# Patient Record
Sex: Female | Born: 2000 | Race: White | Hispanic: No | Marital: Single | Smoking: Former smoker
Health system: Southern US, Community
[De-identification: ages and names within clinical notes are randomized; demographics above are authoritative.]

## PROBLEM LIST (undated history)

## (undated) DIAGNOSIS — F419 Anxiety disorder, unspecified: Secondary | ICD-10-CM

## (undated) DIAGNOSIS — F32A Depression, unspecified: Secondary | ICD-10-CM

## (undated) DIAGNOSIS — F329 Major depressive disorder, single episode, unspecified: Secondary | ICD-10-CM

## (undated) HISTORY — PX: NO PAST SURGERIES: SHX2092

---
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
## 2018-03-19 ENCOUNTER — Ambulatory Visit (INDEPENDENT_AMBULATORY_CARE_PROVIDER_SITE_OTHER): Payer: BC Managed Care – PPO | Admitting: Nurse Practitioner

## 2018-03-19 ENCOUNTER — Other Ambulatory Visit
Admission: RE | Admit: 2018-03-19 | Discharge: 2018-03-19 | Disposition: A | Discharge status: Home / Self Care | Payer: BC Managed Care – PPO | Source: Ambulatory Visit | Attending: Nurse Practitioner | Admitting: Nurse Practitioner

## 2018-03-19 ENCOUNTER — Other Ambulatory Visit: Payer: Self-pay | Admitting: Nurse Practitioner

## 2018-03-19 ENCOUNTER — Encounter: Payer: Self-pay | Admitting: Nurse Practitioner

## 2018-03-19 ENCOUNTER — Other Ambulatory Visit (HOSPITAL_COMMUNITY)
Admission: RE | Admit: 2018-03-19 | Discharge: 2018-03-19 | Disposition: A | Discharge status: Home / Self Care | Payer: BC Managed Care – PPO | Source: Ambulatory Visit | Attending: Nurse Practitioner | Admitting: Nurse Practitioner

## 2018-03-19 VITALS — BP 100/66 | HR 93 | Temp 98.4°F | Resp 12 | Ht 63.0 in | Wt 122.4 lb

## 2018-03-19 DIAGNOSIS — B9689 Other specified bacterial agents as the cause of diseases classified elsewhere: Secondary | ICD-10-CM | POA: Insufficient documentation

## 2018-03-19 DIAGNOSIS — R51 Headache: Secondary | ICD-10-CM

## 2018-03-19 DIAGNOSIS — R52 Pain, unspecified: Secondary | ICD-10-CM

## 2018-03-19 DIAGNOSIS — R6883 Chills (without fever): Secondary | ICD-10-CM

## 2018-03-19 DIAGNOSIS — R519 Headache, unspecified: Secondary | ICD-10-CM

## 2018-03-19 DIAGNOSIS — R5383 Other fatigue: Secondary | ICD-10-CM

## 2018-03-19 DIAGNOSIS — R1030 Lower abdominal pain, unspecified: Secondary | ICD-10-CM | POA: Diagnosis present

## 2018-03-19 DIAGNOSIS — A749 Chlamydial infection, unspecified: Secondary | ICD-10-CM | POA: Insufficient documentation

## 2018-03-19 DIAGNOSIS — N760 Acute vaginitis: Secondary | ICD-10-CM | POA: Insufficient documentation

## 2018-03-19 DIAGNOSIS — H6983 Other specified disorders of Eustachian tube, bilateral: Secondary | ICD-10-CM

## 2018-03-19 DIAGNOSIS — F32A Depression, unspecified: Secondary | ICD-10-CM

## 2018-03-19 DIAGNOSIS — F419 Anxiety disorder, unspecified: Secondary | ICD-10-CM

## 2018-03-19 DIAGNOSIS — H6993 Unspecified Eustachian tube disorder, bilateral: Secondary | ICD-10-CM

## 2018-03-19 DIAGNOSIS — N3090 Cystitis, unspecified without hematuria: Secondary | ICD-10-CM

## 2018-03-19 DIAGNOSIS — F329 Major depressive disorder, single episode, unspecified: Secondary | ICD-10-CM

## 2018-03-19 LAB — POCT URINALYSIS DIPSTICK
Bilirubin, UA: NEGATIVE
Glucose, UA: NEGATIVE
Ketones, UA: NEGATIVE
NITRITE UA: NEGATIVE
PH UA: 8 (ref 5.0–8.0)
PROTEIN UA: POSITIVE — AB
RBC UA: NEGATIVE
Spec Grav, UA: <=1.005 — AB (ref 1.010–1.025)
UROBILINOGEN UA: 0.2 U/dL

## 2018-03-19 LAB — CBC WITH DIFFERENTIAL/PLATELET
ABS IMMATURE GRANULOCYTES: 0.01 10*3/uL (ref 0.00–0.07)
Basophils Absolute: 0 10*3/uL (ref 0.0–0.1)
Basophils Relative: 0 %
EOS PCT: 2 %
Eosinophils Absolute: 0.1 10*3/uL (ref 0.0–0.5)
HEMATOCRIT: 40.6 % (ref 36.0–46.0)
Hemoglobin: 13.3 g/dL (ref 12.0–15.0)
Immature Granulocytes: 0 %
LYMPHS ABS: 2.2 10*3/uL (ref 0.7–4.0)
LYMPHS PCT: 30 %
MCH: 29.2 pg (ref 26.0–34.0)
MCHC: 32.8 g/dL (ref 30.0–36.0)
MCV: 89.2 fL (ref 80.0–100.0)
MONO ABS: 0.8 10*3/uL (ref 0.1–1.0)
Monocytes Relative: 11 %
Neutro Abs: 4.2 10*3/uL (ref 1.7–7.7)
Neutrophils Relative %: 57 %
PLATELETS: 300 10*3/uL (ref 150–400)
RBC: 4.55 MIL/uL (ref 3.87–5.11)
RDW: 13.2 % (ref 11.5–15.5)
Smear Review: ADEQUATE
WBC: 7.3 10*3/uL (ref 4.0–10.5)
nRBC: 0 % (ref 0.0–0.2)

## 2018-03-19 LAB — BASIC METABOLIC PANEL
ANION GAP: 8 (ref 5–15)
BUN: 10 mg/dL (ref 6–20)
CO2: 28 mmol/L (ref 22–32)
Calcium: 10.1 mg/dL (ref 8.9–10.3)
Chloride: 102 mmol/L (ref 98–111)
Creatinine, Ser: 0.7 mg/dL (ref 0.44–1.00)
GFR calc Af Amer: >60 mL/min (ref >60)
Glucose, Bld: 98 mg/dL (ref 70–99)
POTASSIUM: 4.3 mmol/L (ref 3.5–5.1)
Sodium: 138 mmol/L (ref 135–145)

## 2018-03-19 LAB — POCT INFLUENZA A/B
INFLUENZA A, POC: NEGATIVE
Influenza B, POC: NEGATIVE

## 2018-03-19 MED ORDER — FLUTICASONE PROPIONATE 50 MCG/ACT NA SUSP: 2.0000 | Freq: Every day | NASAL | 6 refills | Status: DC

## 2018-03-19 MED ORDER — SULFAMETHOXAZOLE-TRIMETHOPRIM 800-160 MG PO TABS: 1.0000 | ORAL_TABLET | Freq: Two times a day (BID) | ORAL | 0 refills | Status: DC

## 2018-03-19 MED ORDER — OSELTAMIVIR PHOSPHATE 75 MG PO CAPS: 75.0000 mg | ORAL_CAPSULE | Freq: Two times a day (BID) | ORAL | 0 refills | Status: DC

## 2018-03-19 NOTE — Progress Notes (Signed)
Name: Lindsey Norman   MRN: [id number]    DOB: 05/29/2001   Date:03/19/2018       Progress Note  Subjective  Chief Complaint  Chief Complaint  Patient presents with  . Generalized Body Aches  . Abdominal Pain  . Headache  . Fatigue    HPI  Patient presents with generalized body aches, abdominal pain, headache and fatigue ongoing for 2 days. States a few days before that felt off but 2 days ago symptoms, with fevers and chills. Endorses mild intermittent nausea without vomiting- no decrease in appetite. Had an episode of diarrhea Sunday, states formed and had to strain last night.  Some relief with nyquil.  Endorses mild sore throat.  States has had similar episode with headache, abdominal pain, diaphoresis, fatigue in the past but only last 24 hours and self-resolved.   Drinking plenty of water and drinks gingerale and hot tea, getting enough rest. Denies dysuria, unusual vaginal discharge, cough, congestion, shortness of breath.   Patient also requesting psychiatry referral for depression and anxiety.    Patient Active Problem List   Diagnosis Date Noted  . Anxiety 07/30/2017  . Panic attack 07/30/2017  . Migraine 07/30/2017  . Depression 07/30/2017    History reviewed. No pertinent past medical history.  History reviewed. No pertinent surgical history.  Social History   Tobacco Use  . Smoking status: Never Smoker  . Smokeless tobacco: Never Used  Substance Use Topics  . Alcohol use: No    Frequency: Never    Comment: but has in past occasiona     Current Outpatient Medications:  .  Levonorgestrel (KYLEENA) 19.5 MG IUD, by Intrauterine route., Disp: , Rfl:   No Known Allergies  ROS   No other specific complaints in a complete review of systems (except as listed in HPI above).  Objective  Vitals:   03/19/18 1623  BP: 100/66  Pulse: 93  Resp: 12  Temp: 98.4 F (36.9 C)  TempSrc: Oral  SpO2: 99%  Weight: 122 lb 6.4 oz (55.5 kg)  Height: 5\' 3"   (1.6 m)     Body mass index is 21.68 kg/m.  Nursing Note and Vital Signs reviewed.  Physical Exam  Constitutional: She is oriented to person, place, and time. She appears well-developed and well-nourished.  HENT:  Head: Normocephalic and atraumatic.  Right Ear: Hearing, external ear and ear canal normal. A middle ear effusion is present.  Left Ear: Hearing, external ear and ear canal normal. A middle ear effusion is present.  Nose: Mucosal edema present. Right sinus exhibits no maxillary sinus tenderness and no frontal sinus tenderness. Left sinus exhibits no maxillary sinus tenderness and no frontal sinus tenderness.  Mouth/Throat: Uvula is midline, oropharynx is clear and moist and mucous membranes are normal.  Neck: Normal range of motion. Neck supple. Carotid bruit is not present.  Cardiovascular: Normal heart sounds and intact distal pulses.  Pulmonary/Chest: Effort normal and breath sounds normal.  Abdominal: Soft. Bowel sounds are normal. There is tenderness (no rebound tenderness) in the right lower quadrant, suprapubic area and left lower quadrant. There is no CVA tenderness.  Musculoskeletal: Normal range of motion.  Neurological: She is alert and oriented to person, place, and time. She has normal strength. No sensory deficit. GCS eye subscore is 4. GCS verbal subscore is 5. GCS motor subscore is 6.  Skin: Skin is warm, dry and intact. Capillary refill takes less than 2 seconds.  Psychiatric: She has a normal mood and affect. Her  speech is normal and behavior is normal. Judgment and thought content normal.  Vitals reviewed.     No results found for this or any previous visit (from the past 48 hour(s)).  Assessment & Plan  1. Generalized body aches - POCT Influenza A/B - CBC with Differential; Future - Urine Culture  2. Acute nonintractable headache, unspecified headache type - POCT Influenza A/B - CBC with Differential; Future  3. Other fatigue - POCT Influenza  A/B - CBC with Differential; Future - Urine Culture  4. Chills - POCT Influenza A/B - CBC with Differential; Future - Urine Culture  5. Eustachian tube dysfunction, bilateral - fluticasone (FLONASE) 50 MCG/ACT nasal spray; Place 2 sprays into both nostrils daily.  Dispense: 16 g; Refill: 6  6. Lower abdominal pain  - POCT Urinalysis Dipstick - Cervicovaginal ancillary only - CBC with Differential; Future - Urinalysis, Routine w reflex microscopic; Future - Urine Culture  7. Anxiety - Ambulatory referral to Psychiatry  8. Depression, unspecified depression type - Ambulatory referral to Psychiatry  9. Cystitis - sulfamethoxazole-trimethoprim (BACTRIM DS,SEPTRA DS) 800-160 MG tablet; Take 1 tablet by mouth 2 (two) times daily. Take with food.  Dispense: 10 tablet; Refill: 0 - Urine Culture   Appears to be viral, flu-like illness, discussed case with patients PCP, Dr. Sherie Don- recommended stat CBC, and testing for GU infections due lower abdominal pain patient agreeable to this plan; discussed ER precautions.

## 2018-03-19 NOTE — Patient Instructions (Signed)
For your ears: - You can do exercises to open up the tubes. This includes swallowing, yawning, or chewing gum - Use flonase nasal spray daily during allergy season or when having ear fullness - Take an antihistamine like zyrtec, Claritin or xyzal.  ___________ Bonita Quin likely have a viral upper respiratory infection (URI). Antibiotics will not reduce the number of days you are ill or prevent you from getting bacterial rhinosinusitis. A URI can take up to 14 days to resolve, but typically last between 7-11 days. Your body is so smart and strong that it will be fighting this illness off for you but it is important that you drink plenty of fluids, rest. Cover your nose/mouth when you cough or sneeze and wash your hands well and often. Here are some helpful things you can use or pick up over the counter from the pharmacy to help with your symptoms:   For Fever/Pain: Acetaminophen every 6 hours as needed (maximum of 3000mg  a day). If you are still uncomfortable you can add ibuprofen OR naproxen  For coughing: try dextromethorphan for a cough suppressant, and/or a cool mist humidifier, lozenges  For sore throat: saline gargles, honey herbal tea, lozenges, throat spray  To dry out your nose: try an antihistamine like loratadine (non-sedating) or diphenhydramine (sedating) or others To relieve a stuffy nose: try an oral decongestant  Like pseudoephedrine if you are under the age of 50 and do not have high blood pressure, neti pot To make blowing your nose easier: guaifenesin

## 2018-03-20 ENCOUNTER — Other Ambulatory Visit: Payer: Self-pay | Admitting: Nurse Practitioner

## 2018-03-21 LAB — URINE CULTURE
MICRO NUMBER: 91274869
RESULT: NO GROWTH
SPECIMEN QUALITY:: ADEQUATE

## 2018-03-22 LAB — CERVICOVAGINAL ANCILLARY ONLY
Bacterial vaginitis: POSITIVE — AB
CHLAMYDIA, DNA PROBE: POSITIVE — AB
NEISSERIA GONORRHEA: NEGATIVE
Trichomonas: NEGATIVE

## 2018-03-25 ENCOUNTER — Encounter: Payer: Self-pay | Admitting: Nurse Practitioner

## 2018-03-25 ENCOUNTER — Ambulatory Visit: Payer: BC Managed Care – PPO | Admitting: Nurse Practitioner

## 2018-03-25 VITALS — BP 100/80 | HR 87 | Temp 98.4°F | Resp 12 | Ht 63.0 in | Wt 116.2 lb

## 2018-03-25 DIAGNOSIS — N760 Acute vaginitis: Secondary | ICD-10-CM | POA: Diagnosis not present

## 2018-03-25 DIAGNOSIS — A749 Chlamydial infection, unspecified: Secondary | ICD-10-CM | POA: Diagnosis not present

## 2018-03-25 DIAGNOSIS — B9689 Other specified bacterial agents as the cause of diseases classified elsewhere: Secondary | ICD-10-CM

## 2018-03-25 DIAGNOSIS — Z23 Encounter for immunization: Secondary | ICD-10-CM

## 2018-03-25 MED ORDER — METRONIDAZOLE 500 MG PO TABS: 500.0000 mg | ORAL_TABLET | Freq: Two times a day (BID) | ORAL | 0 refills | Status: DC

## 2018-03-25 MED ORDER — AZITHROMYCIN 500 MG PO TABS
1000.0000 mg | ORAL_TABLET | Freq: Once | ORAL | Status: AC
  Administered 2018-03-25: 1000 mg via ORAL

## 2018-03-25 NOTE — Patient Instructions (Signed)
For more information about treatment today and prevention. Please visit [email or web address]

## 2018-03-25 NOTE — Progress Notes (Addendum)
Name: Lindsey Norman   MRN: [id number]    DOB: 07-09-2000   Date:03/25/2018       Progress Note  Subjective  Chief Complaint  Chief Complaint  Patient presents with  . Exposure to STD  . Urinary Tract Infection    follow up. Still taking antibiotics. She feel alot better.    HPI Patient presents for witnessed treatment for chlamydia; states urinary urgency has improved significantly from antibiotic from UTI.  Denies vaginal discharge or pain, states she has already informed previous partner who is scheduled for treatment.   Patient Active Problem List   Diagnosis Date Noted  . Anxiety 07/30/2017  . Panic attack 07/30/2017  . Migraine 07/30/2017  . Depression 07/30/2017    History reviewed. No pertinent past medical history.  History reviewed. No pertinent surgical history.  Social History   Tobacco Use  . Smoking status: Never Smoker  . Smokeless tobacco: Never Used  Substance Use Topics  . Alcohol use: No    Frequency: Never    Comment: but has in past occasiona     Current Outpatient Medications:  .  fluticasone (FLONASE) 50 MCG/ACT nasal spray, Place 2 sprays into both nostrils daily., Disp: 16 g, Rfl: 6 .  Levonorgestrel (KYLEENA) 19.5 MG IUD, by Intrauterine route., Disp: , Rfl:  .  sulfamethoxazole-trimethoprim (BACTRIM DS,SEPTRA DS) 800-160 MG tablet, Take 1 tablet by mouth 2 (two) times daily. Take with food., Disp: 10 tablet, Rfl: 0  No Known Allergies  ROS   No other specific complaints in a complete review of systems (except as listed in HPI above).  Objective  Vitals:   03/25/18 0828  BP: 100/80  Pulse: 87  Resp: 12  Temp: 98.4 F (36.9 C)  TempSrc: Oral  SpO2: 99%  Weight: 116 lb 3.2 oz (52.7 kg)  Height: 5\' 3"  (1.6 m)    Body mass index is 20.58 kg/m.  Nursing Note and Vital Signs reviewed.  Physical Exam  Constitutional: She is oriented to person, place, and time. She appears well-developed and well-nourished.  HENT:  Head:  Normocephalic and atraumatic.  Cardiovascular: Normal rate.  Pulmonary/Chest: Effort normal.  Neurological: She is alert and oriented to person, place, and time. Coordination normal.  Skin: Skin is warm and dry. She is not diaphoretic. No erythema.  Psychiatric: She has a normal mood and affect. Her behavior is normal. Judgment and thought content normal.       No results found for this or any previous visit (from the past 48 hour(s)).  Assessment & Plan  1. BV (bacterial vaginosis) Discussed safe sex - metroNIDAZOLE (FLAGYL) 500 MG tablet; Take 1 tablet (500 mg total) by mouth 2 (two) times daily.  Dispense: 14 tablet; Refill: 0  2. Chlamydia Discussed safe sex - azithromycin (ZITHROMAX) tablet 1,000 mg  3. Need for influenza vaccination - Flu Vaccine QUAD 6+ mos PF IM (Fluarix Quad PF)  - UA noted protein in urine, plan to recheck at follow up visit; patient informed

## 2018-04-01 ENCOUNTER — Ambulatory Visit: Payer: Self-pay | Admitting: Psychiatry

## 2018-04-04 ENCOUNTER — Ambulatory Visit: Payer: BC Managed Care – PPO | Admitting: Family Medicine

## 2018-04-09 ENCOUNTER — Ambulatory Visit: Payer: BC Managed Care – PPO | Admitting: Family Medicine

## 2018-04-11 ENCOUNTER — Encounter: Payer: Self-pay | Admitting: Emergency Medicine

## 2018-04-11 ENCOUNTER — Emergency Department
Admission: EM | Admit: 2018-04-11 | Discharge: 2018-04-11 | Disposition: A | Discharge status: Home / Self Care | Payer: BC Managed Care – PPO | Attending: Emergency Medicine | Admitting: Emergency Medicine

## 2018-04-11 ENCOUNTER — Other Ambulatory Visit: Payer: Self-pay

## 2018-04-11 ENCOUNTER — Encounter: Payer: Self-pay | Admitting: Family Medicine

## 2018-04-11 ENCOUNTER — Ambulatory Visit: Payer: BC Managed Care – PPO | Admitting: Family Medicine

## 2018-04-11 VITALS — BP 102/64 | HR 74 | Temp 98.0°F | Ht 63.0 in | Wt 123.8 lb

## 2018-04-11 DIAGNOSIS — E559 Vitamin D deficiency, unspecified: Secondary | ICD-10-CM | POA: Diagnosis not present

## 2018-04-11 DIAGNOSIS — R45851 Suicidal ideations: Secondary | ICD-10-CM | POA: Diagnosis not present

## 2018-04-11 DIAGNOSIS — F17290 Nicotine dependence, other tobacco product, uncomplicated: Secondary | ICD-10-CM | POA: Diagnosis not present

## 2018-04-11 DIAGNOSIS — F332 Major depressive disorder, recurrent severe without psychotic features: Secondary | ICD-10-CM | POA: Diagnosis not present

## 2018-04-11 DIAGNOSIS — F419 Anxiety disorder, unspecified: Secondary | ICD-10-CM | POA: Diagnosis not present

## 2018-04-11 DIAGNOSIS — Z79899 Other long term (current) drug therapy: Secondary | ICD-10-CM | POA: Diagnosis not present

## 2018-04-11 DIAGNOSIS — E538 Deficiency of other specified B group vitamins: Secondary | ICD-10-CM | POA: Diagnosis not present

## 2018-04-11 DIAGNOSIS — F339 Major depressive disorder, recurrent, unspecified: Secondary | ICD-10-CM | POA: Insufficient documentation

## 2018-04-11 DIAGNOSIS — F331 Major depressive disorder, recurrent, moderate: Secondary | ICD-10-CM

## 2018-04-11 DIAGNOSIS — F329 Major depressive disorder, single episode, unspecified: Secondary | ICD-10-CM

## 2018-04-11 DIAGNOSIS — F32A Depression, unspecified: Secondary | ICD-10-CM

## 2018-04-11 DIAGNOSIS — F1210 Cannabis abuse, uncomplicated: Secondary | ICD-10-CM

## 2018-04-11 HISTORY — DX: Major depressive disorder, single episode, unspecified: F32.9

## 2018-04-11 HISTORY — DX: Anxiety disorder, unspecified: F41.9

## 2018-04-11 HISTORY — DX: Depression, unspecified: F32.A

## 2018-04-11 LAB — URINE DRUG SCREEN, QUALITATIVE (ARMC ONLY)
Amphetamines, Ur Screen: NOT DETECTED
BENZODIAZEPINE, UR SCRN: NOT DETECTED
Barbiturates, Ur Screen: NOT DETECTED
Cannabinoid 50 Ng, Ur ~~LOC~~: POSITIVE — AB
Cocaine Metabolite,Ur ~~LOC~~: NOT DETECTED
MDMA (Ecstasy)Ur Screen: NOT DETECTED
METHADONE SCREEN, URINE: NOT DETECTED
Opiate, Ur Screen: NOT DETECTED
Phencyclidine (PCP) Ur S: NOT DETECTED
TRICYCLIC, UR SCREEN: NOT DETECTED

## 2018-04-11 LAB — ETHANOL: Alcohol, Ethyl (B): <10 mg/dL (ref <10)

## 2018-04-11 LAB — CBC
HCT: 39.3 % (ref 36.0–46.0)
HEMOGLOBIN: 13 g/dL (ref 12.0–15.0)
MCH: 29.4 pg (ref 26.0–34.0)
MCHC: 33.1 g/dL (ref 30.0–36.0)
MCV: 88.9 fL (ref 80.0–100.0)
NRBC: 0 % (ref 0.0–0.2)
PLATELETS: 295 10*3/uL (ref 150–400)
RBC: 4.42 MIL/uL (ref 3.87–5.11)
RDW: 13.1 % (ref 11.5–15.5)
WBC: 5.4 10*3/uL (ref 4.0–10.5)

## 2018-04-11 LAB — COMPREHENSIVE METABOLIC PANEL
ALK PHOS: 65 U/L (ref 38–126)
ALT: 32 U/L (ref 0–44)
AST: 30 U/L (ref 15–41)
Albumin: 4.6 g/dL (ref 3.5–5.0)
Anion gap: 7 (ref 5–15)
BUN: 16 mg/dL (ref 6–20)
CALCIUM: 10 mg/dL (ref 8.9–10.3)
CO2: 26 mmol/L (ref 22–32)
CREATININE: 0.56 mg/dL (ref 0.44–1.00)
Chloride: 105 mmol/L (ref 98–111)
GFR calc non Af Amer: >60 mL/min (ref >60)
Glucose, Bld: 98 mg/dL (ref 70–99)
Potassium: 4.6 mmol/L (ref 3.5–5.1)
SODIUM: 138 mmol/L (ref 135–145)
Total Bilirubin: 0.9 mg/dL (ref 0.3–1.2)
Total Protein: 8 g/dL (ref 6.5–8.1)

## 2018-04-11 LAB — POCT PREGNANCY, URINE: PREG TEST UR: NEGATIVE

## 2018-04-11 LAB — SALICYLATE LEVEL

## 2018-04-11 LAB — ACETAMINOPHEN LEVEL: Acetaminophen (Tylenol), Serum: <10 ug/mL — ABNORMAL LOW (ref 10–30)

## 2018-04-11 MED ORDER — VITAMIN D (ERGOCALCIFEROL) 1.25 MG (50000 UNIT) PO CAPS: 50000.0000 [IU] | ORAL_CAPSULE | ORAL | 1 refills | Status: AC

## 2018-04-11 MED ORDER — VITAMIN B-12 500 MCG SL SUBL: SUBLINGUAL_TABLET | SUBLINGUAL | 2 refills | Status: DC

## 2018-04-11 NOTE — ED Notes (Signed)

## 2018-04-11 NOTE — ED Triage Notes (Signed)
Pt to ED from home with mom c/o SI, voluntary, states depressed and thoughts of hurting self without specific plan.  Denies HI.  Reports no psychiatric hx and denies medications.  Pt quiet but calm and cooperative in triage.

## 2018-04-11 NOTE — ED Provider Notes (Signed)
[facility] Emergency Department Provider Note  ____________________________________________   I have reviewed the triage vital signs and the nursing notes. Where available I have reviewed prior notes and, if possible and indicated, outside hospital notes.    HISTORY  Chief Complaint Suicidal    HPI Lindsey Norman is a 17 y.o. female who presents today complaining of feeling depressed.  She has no SI or HI, she states that she is not being abused or beaten or sexually abused, she states that she is having some relationship issues.  She states she has not taken an overdose not try to hurt herself has no plan to do so, but she did want to see someone about this.  She does contract for safety.  Patient states sometimes she gets a depressed she wonders about suicide but she has no actual thoughts of doing so and denies that she would ever do it      Past Medical History:  Diagnosis Date  . Anxiety   . Depression     Patient Active Problem List   Diagnosis Date Noted  . Vitamin D deficiency 04/11/2018  . Vitamin B12 deficiency 04/11/2018  . Depression, major, recurrent (HCC) 04/11/2018  . Anxiety 07/30/2017  . Panic attack 07/30/2017  . Migraine 07/30/2017  . Depression 07/30/2017    History reviewed. No pertinent surgical history.  Prior to Admission medications   Medication Sig Start Date End Date Taking? Authorizing Provider  Cyanocobalamin (VITAMIN B-12) 500 MCG SUBL One under tongue once a day 04/11/18   Lada, Janit BernMelinda P, MD  fluticasone (FLONASE) 50 MCG/ACT nasal spray Place 2 sprays into both nostrils daily. Patient not taking: Reported on 04/11/2018 03/19/18   Cheryle HorsfallPoulose, Elizabeth E, NP  Levonorgestrel [facility](KYLEENA) 19.5 MG IUD by Intrauterine route.    [provider]  Vitamin D, Ergocalciferol, (DRISDOL) 1.25 MG (50000 UT) CAPS capsule Take 1 capsule (50,000 Units total) by mouth every 7 (seven) days. 04/11/18 06/06/18  Kerman PasseyLada, Melinda P, MD     Allergies Patient has no known allergies.  Family History  Problem Relation Age of Onset  . COPD Mother        related to smoking  . Anxiety disorder Mother   . Depression Mother   . Mental illness Mother        anxiety  . Depression Father   . Mental illness Father        anxiety  . Cancer Maternal Grandfather        lung    Social History Social History   Tobacco Use  . Smoking status: Current Every Day Smoker    Types: E-cigarettes    Start date: 11/26/2017  . Smokeless tobacco: Never Used  Substance Use Topics  . Alcohol use: No    Frequency: Never    Comment: but has in past occasiona  . Drug use: Yes    Types: Marijuana    Comment: in past marijuana    Review of Systems Constitutional: No fever/chills Eyes: No visual changes. ENT: No sore throat. No stiff neck no neck pain Cardiovascular: Denies chest pain. Respiratory: Denies shortness of breath. Gastrointestinal:   no vomiting.  No diarrhea.  No constipation. Genitourinary: Negative for dysuria. Musculoskeletal: Negative lower extremity swelling Skin: Negative for rash. Neurological: Negative for severe headaches, focal weakness or numbness.   ____________________________________________   PHYSICAL EXAM:  VITAL SIGNS: ED Triage Vitals  Enc Vitals Group     BP 04/11/18 1127 112/75  Pulse Rate 04/11/18 1127 78     Resp 04/11/18 1127 18     Temp 04/11/18 1127 98 F (36.7 C)     Temp Source 04/11/18 1127 Oral     SpO2 04/11/18 1127 100 %     Weight 04/11/18 1128 123 lb 7.3 oz (56 kg)     Height 04/11/18 1128 5\' 3"  (1.6 m)     Head Circumference --      Peak Flow --      Pain Score 04/11/18 1128 0     Pain Loc --      Pain Edu? --      Excl. in GC? --     Constitutional: Alert and oriented. Well appearing and in no acute distress. Eyes: Conjunctivae are normal Head: Atraumatic HEENT: No congestion/rhinnorhea. Mucous membranes are moist.  Oropharynx non-erythematous Neck:    Nontender with no meningismus, no masses, no stridor Cardiovascular: Normal rate, regular rhythm. Grossly normal heart sounds.  Good peripheral circulation. Respiratory: Normal respiratory effort.  No retractions. Lungs CTAB. Abdominal: Soft and nontender. No distention. No guarding no rebound Back:  There is no focal tenderness or step off.  there is no midline tenderness there are no lesions noted. there is no CVA tenderness Musculoskeletal: No lower extremity tenderness, no upper extremity tenderness. No joint effusions, no DVT signs strong distal pulses no edema Neurologic:  Normal speech and language. No gross focal neurologic deficits are appreciated.  Skin:  Skin is warm, dry and intact. No rash noted. Psychiatric: Mood and affect are normal. Speech and behavior are normal. Female chaperone present for all of the exam  ____________________________________________   LABS (all labs ordered are listed, but only abnormal results are displayed)  Labs Reviewed  ACETAMINOPHEN LEVEL - Abnormal; Notable for the following components:      Result Value   Acetaminophen (Tylenol), Serum <10 (*)    All other components within normal limits  COMPREHENSIVE METABOLIC PANEL  ETHANOL  SALICYLATE LEVEL  CBC  URINE DRUG SCREEN, QUALITATIVE ([facility] ONLY)  POC URINE PREG, ED    Pertinent labs  results that were available during my care of the patient were reviewed by me and considered in my medical decision making (see chart for details). ____________________________________________  EKG  I personally interpreted any EKGs ordered by me or triage  ____________________________________________  RADIOLOGY  Pertinent labs & imaging results that were available during my care of the patient were reviewed by me and considered in my medical decision making (see chart for details). If possible, patient and/or family made aware of any abnormal findings.  No results  found. ____________________________________________    PROCEDURES  Procedure(s) performed: None  Procedures  Critical Care performed: None  ____________________________________________   INITIAL IMPRESSION / ASSESSMENT AND PLAN / ED COURSE  Pertinent labs & imaging results that were available during my care of the patient were reviewed by me and considered in my medical decision making (see chart for details).  With depression.  She was evaluated by psychiatry.  She has no overt SI or HI and she declines voluntary admission.  Psychiatrist, Dr. Toni Amend, discussed starting her on medications but she would prefer not to do so.  She is scheduled to follow-up closely as an outpatient with psychiatry.  Given that psychiatrist does not feel that there is any indication for keeping her, and her exam does not betray any evidence of toxidrome overdose or self-harm, we will discharge her.  Patient does contract for safety, and psychiatry  will assist in getting her close outpatient follow-up.    ____________________________________________   FINAL CLINICAL IMPRESSION(S) / ED DIAGNOSES  Final diagnoses:  None      This chart was dictated using voice recognition software.  Despite best efforts to proofread,  errors can occur which can change meaning.      Jeanmarie Plant, MD 04/11/18 1330

## 2018-04-11 NOTE — Discharge Instructions (Addendum)
Sorry you are having a tough time of it, things get better.  If you have any thoughts of hurting yourself or anyone else,  as we agreed,  please return to the emergency room right away.  Certainly do not ever act upon such impulses.  There is help out there and we are happy to provide it.  Follow closely with your primary care doctor and psychiatry.  Ramona is a depressant, we advise that you do not use it.

## 2018-04-11 NOTE — Consult Note (Signed)
[facility] Face-to-Face Psychiatry Consult   Reason for Consult: Consult for this 17 year old woman who came voluntarily to the emergency room referred by her primary care doctor Referring Physician: McShane Patient Identification: TANIKKA BRESNAN MRN:  [id number] Principal Diagnosis: Moderate recurrent major depression ([place]) Diagnosis:   Patient Active Problem List   Diagnosis Date Noted  . Vitamin D deficiency [E55.9] 04/11/2018  . Vitamin B12 deficiency [E53.8] 04/11/2018  . Depression, major, recurrent ([place]) [F33.9] 04/11/2018  . Moderate recurrent major depression ([place]) [F33.1] 04/11/2018  . Cannabis abuse [F12.10] 04/11/2018  . Anxiety [F41.9] 07/30/2017  . Panic attack [F41.0] 07/30/2017  . Migraine [G43.909] 07/30/2017  . Depression [F32.9] 07/30/2017    Total Time spent with patient: 1 hour  Subjective:   Alaiza L Pociask is a 17 y.o. female patient admitted with "I have been feeling depressed".  HPI: Patient seen chart reviewed.  17 year old woman reports multiple symptoms of depression.  Mood feels down and sad.  Feels like she has no connection to people.  Feels isolated much of the time.  Energy level is low.  Motivation feels low.  Patient is not sleeping well at night.  Appetite has been increased.  One recent stress is her father's relationship with her.  Her father got angry with her when he found out that she was having sex with her most recent boyfriend.  This has damaged the relationship badly.  Also the relationship itself has now ended with the boyfriend which is another big stress.  Patient says she has had suicidal thoughts but has had no specific intention or plan.  Has not acted on it or prepared to act on it.  Denies homicidal ideation denies auditory hallucinations or any psychotic symptoms.  She is not currently receiving any mental health or psychiatric treatment.  Patient says she drinks only occasionally but uses marijuana every day and has been doing so for  months.  Social history: Stays with her mother.  Relationship with her father has suffered in the last few months because of his reaction to learning that she was having sex with her boyfriend.  Patient graduated from high school.  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  Vehicle history: No significant active medical problems  Substance abuse history: Smoking marijuana daily for several months.  Not using any other drugs regularly.  Past Psychiatric History: Patient has never been in a psychiatric hospital.  No history of medicine prescription.  No history of any kind of psychiatric intervention previously no prior suicide attempts or violence.  Risk to Self:   Risk to Others:   Prior Inpatient Therapy:   Prior Outpatient Therapy:    Past Medical History:  Past Medical History:  Diagnosis Date  . Anxiety   . Depression    History reviewed. No pertinent surgical history. Family History:  Family History  Problem Relation Age of Onset  . COPD Mother        related to smoking  . Anxiety disorder Mother   . Depression Mother   . Mental illness Mother        anxiety  . Depression Father   . Mental illness Father        anxiety  . Cancer Maternal Grandfather        lung   Family Psychiatric  History: Says her father has had depression and her mother has had anxiety problems.  No known family history of suicide Social History:  Social History  Substance and Sexual Activity  Alcohol Use No  . Frequency: Never   Comment: but has in past occasiona     Social History   Substance and Sexual Activity  Drug Use Yes  . Types: Marijuana   Comment: in past marijuana    Social History   Socioeconomic History  . Marital status: Single    Spouse name: Not on file  . Number of children: Not on file  . Years of education: Not on file  . Highest education level: Not on file  Occupational History  . Not on file  Social Needs  . Financial  resource strain: Not on file  . Food insecurity:    Worry: Not on file    Inability: Not on file  . Transportation needs:    Medical: Not on file    Non-medical: Not on file  Tobacco Use  . Smoking status: Current Every Day Smoker    Types: E-cigarettes    Start date: 11/26/2017  . Smokeless tobacco: Never Used  Substance and Sexual Activity  . Alcohol use: No    Frequency: Never    Comment: but has in past occasiona  . Drug use: Yes    Types: Marijuana    Comment: in past marijuana  . Sexual activity: Yes    Birth control/protection: IUD    Comment: kyleena  Lifestyle  . Physical activity:    Days per week: Not on file    Minutes per session: Not on file  . Stress: Not on file  Relationships  . Social connections:    Talks on phone: Not on file    Gets together: Not on file    Attends religious service: Not on file    Active member of club or organization: Not on file    Attends meetings of clubs or organizations: Not on file    Relationship status: Not on file  Other Topics Concern  . Not on file  Social History Narrative  . Not on file   Additional Social History:    Allergies:  No Known Allergies  Labs:  Results for orders placed or performed during the hospital encounter of 04/11/18 (from the past 48 hour(s))  Comprehensive metabolic panel     Status: None   Collection Time: 04/11/18 11:33 AM  Result Value Ref Range   Sodium 138 135 - 145 mmol/L   Potassium 4.6 3.5 - 5.1 mmol/L   Chloride 105 98 - 111 mmol/L   CO2 26 22 - 32 mmol/L   Glucose, Bld 98 70 - 99 mg/dL   BUN 16 6 - 20 mg/dL   Creatinine, Ser 0.56 0.44 - 1.00 mg/dL   Calcium 10.0 8.9 - 10.3 mg/dL   Total Protein 8.0 6.5 - 8.1 g/dL   Albumin 4.6 3.5 - 5.0 g/dL   AST 30 15 - 41 U/L   ALT 32 0 - 44 U/L   Alkaline Phosphatase 65 38 - 126 U/L   Total Bilirubin 0.9 0.3 - 1.2 mg/dL   GFR calc non Af Amer >60 >60 mL/min   GFR calc Af Amer >60 >60 mL/min    Comment: (NOTE) The eGFR has been  calculated using the CKD EPI equation. This calculation has not been validated in all clinical situations. eGFR's persistently <60 mL/min signify possible Chronic Kidney Disease.    Anion gap 7 5 - 15    Comment: Performed at [facility], [address] [place], [place] [address]  Ethanol  Status: None   Collection Time: 04/11/18 11:33 AM  Result Value Ref Range   Alcohol, Ethyl (B) <10 <10 mg/dL    Comment: (NOTE) Lowest detectable limit for serum alcohol is 10 mg/dL. For medical purposes only. Performed at [facility], [address] [place], [place] [address]   Salicylate level     Status: None   Collection Time: 04/11/18 11:33 AM  Result Value Ref Range   Salicylate Lvl <2.9 2.8 - 30.0 mg/dL    Comment: Performed at [facility], [address] [place], [place] [address]  Acetaminophen level     Status: Abnormal   Collection Time: 04/11/18 11:33 AM  Result Value Ref Range   Acetaminophen (Tylenol), Serum <10 (L) 10 - 30 ug/mL    Comment: (NOTE) Therapeutic concentrations vary significantly. A range of 10-30 ug/mL  may be an effective concentration for many patients. However, some  are best treated at concentrations outside of this range. Acetaminophen concentrations >150 ug/mL at 4 hours after ingestion  and >50 ug/mL at 12 hours after ingestion are often associated with  toxic reactions. Performed at [facility], [address] [place], [place] [address]   cbc     Status: None   Collection Time: 04/11/18 11:33 AM  Result Value Ref Range   WBC 5.4 4.0 - 10.5 K/uL   RBC 4.42 3.87 - 5.11 MIL/uL   Hemoglobin 13.0 12.0 - 15.0 g/dL   HCT 39.3 36.0 - 46.0 %   MCV 88.9 80.0 - 100.0 fL   MCH 29.4 26.0 - 34.0 pg   MCHC 33.1 30.0 - 36.0 g/dL   RDW 13.1 11.5 - 15.5 %   Platelets 295 150 - 400 K/uL   nRBC 0.0 0.0 - 0.2 %    Comment: Performed at [facility], [address], [place], [place] [address]  Urine Drug  Screen, Qualitative     Status: Abnormal   Collection Time: 04/11/18 11:33 AM  Result Value Ref Range   Tricyclic, Ur Screen NONE DETECTED NONE DETECTED   Amphetamines, Ur Screen NONE DETECTED NONE DETECTED   MDMA (Ecstasy)Ur Screen NONE DETECTED NONE DETECTED   Cocaine Metabolite,Ur Altha NONE DETECTED NONE DETECTED   Opiate, Ur Screen NONE DETECTED NONE DETECTED   Phencyclidine (PCP) Ur S NONE DETECTED NONE DETECTED   Cannabinoid 50 Ng, Ur  POSITIVE (A) NONE DETECTED   Barbiturates, Ur Screen NONE DETECTED NONE DETECTED   Benzodiazepine, Ur Scrn NONE DETECTED NONE DETECTED   Methadone Scn, Ur NONE DETECTED NONE DETECTED    Comment: (NOTE) Tricyclics + metabolites, urine    Cutoff 1000 ng/mL Amphetamines + metabolites, urine  Cutoff 1000 ng/mL MDMA (Ecstasy), urine              Cutoff 500 ng/mL Cocaine Metabolite, urine          Cutoff 300 ng/mL Opiate + metabolites, urine        Cutoff 300 ng/mL Phencyclidine (PCP), urine         Cutoff 25 ng/mL Cannabinoid, urine                 Cutoff 50 ng/mL Barbiturates + metabolites, urine  Cutoff 200 ng/mL Benzodiazepine, urine              Cutoff 200 ng/mL Methadone, urine                   Cutoff 300 ng/mL The urine drug screen provides only a preliminary,  unconfirmed analytical test result and should not be used for non-medical purposes. Clinical consideration and professional judgment should be applied to any positive drug screen result due to possible interfering substances. A more specific alternate chemical method must be used in order to obtain a confirmed analytical result. Gas chromatography / mass spectrometry (GC/MS) is the preferred confirmat ory method. Performed at [facility], [address] [place], [place] [address]   Pregnancy, urine POC     Status: None   Collection Time: 04/11/18  1:29 PM  Result Value Ref Range   Preg Test, Ur NEGATIVE NEGATIVE    Comment:        THE SENSITIVITY OF THIS METHODOLOGY  IS >24 mIU/mL     No current facility-administered medications for this encounter.    Current Outpatient Medications  Medication Sig Dispense Refill  . Cyanocobalamin (VITAMIN B-12) 500 MCG SUBL One under tongue once a day 30 tablet 2  . fluticasone (FLONASE) 50 MCG/ACT nasal spray Place 2 sprays into both nostrils daily. (Patient not taking: Reported on 04/11/2018) 16 g 6  . Levonorgestrel (KYLEENA) 19.5 MG IUD by Intrauterine route.    . Vitamin D, Ergocalciferol, (DRISDOL) 1.25 MG (50000 UT) CAPS capsule Take 1 capsule (50,000 Units total) by mouth every 7 (seven) days. 4 capsule 1    Musculoskeletal: Strength & Muscle Tone: within normal limits Gait & Station: normal Patient leans: N/A  Psychiatric Specialty Exam: Physical Exam  Nursing note and vitals reviewed. Constitutional: She appears well-developed and well-nourished.  HENT:  Head: Normocephalic and atraumatic.  Eyes: Pupils are equal, round, and reactive to light. Conjunctivae are normal.  Neck: Normal range of motion.  Cardiovascular: Regular rhythm and normal heart sounds.  Respiratory: Effort normal. No respiratory distress.  GI: Soft.  Musculoskeletal: Normal range of motion.  Neurological: She is alert.  Skin: Skin is warm and dry.  Psychiatric: Judgment normal. Her speech is delayed. She is slowed. Cognition and memory are normal. She exhibits a depressed mood. She expresses suicidal ideation. She expresses no suicidal plans.    Review of Systems  Constitutional: Negative.   HENT: Negative.   Eyes: Negative.   Respiratory: Negative.   Cardiovascular: Negative.   Gastrointestinal: Negative.   Musculoskeletal: Negative.   Skin: Negative.   Neurological: Negative.   Psychiatric/Behavioral: Positive for depression, substance abuse and suicidal ideas. Negative for hallucinations and memory loss. The patient is not nervous/anxious and does not have insomnia.     Blood pressure 114/76, pulse 78, temperature 98  F (36.7 C), resp. rate 18, height '5\' 3"'  (1.6 m), weight 56 kg, SpO2 99 %.Body mass index is 21.87 kg/m.  General Appearance: Casual  Eye Contact:  Good  Speech:  Clear and Coherent  Volume:  Normal  Mood:  Depressed  Affect:  Congruent  Thought Process:  Goal Directed  Orientation:  Full (Time, Place, and Person)  Thought Content:  Logical  Suicidal Thoughts:  Yes.  without intent/plan  Homicidal Thoughts:  No  Memory:  Immediate;   Fair Recent;   Fair Remote;   Fair  Judgement:  Fair  Insight:  Fair  Psychomotor Activity:  Decreased  Concentration:  Concentration: Fair  Recall:  [employer] of Knowledge:  Fair  Language:  Fair  Akathisia:  No  Handed:  Right  AIMS (if indicated):     Assets:  Desire for Improvement Housing Physical Health Social Support  ADL's:  Intact  Cognition:  WNL  Sleep:  Treatment Plan Summary: Medication management and Plan 17 year old woman with multiple symptoms of depression.  Patient acknowledges having had suicidal thoughts but has never attempted to harm herself and has no intention or plan to act on it.  Able to articulate positive plans for the future.  Patient is calm and rational.  Acknowledges that what she really wants is to get treatment for her depression not to hurt her self.  Patient does not meet commitment criteria.  Patient is agreeable to getting outpatient treatment.  We discussed antidepressant medication and she does not want to start any medicine right now.  I did not press the issue but did give her some psychoeducation about the utility of antidepressants.  It turns out that the patient had had a appointment to see Dr. Shea Evans in the outpatient clinic on 4 November.  She no showed for the appointment.  I spoke with the staff at our clinic.  Normally a no-show for an intake would result in not being able to reschedule but I ask on the patient's behalf if they would be willing to reschedule.  Patient was given the phone  number of the clinic and instructed to call back and reschedule her appointment and then to follow through on it.  She agrees to plan.  Patient can be released from the emergency room no need for IV C.  Case reviewed with emergency room doctor.  Disposition: No evidence of imminent risk to self or others at present.   Patient does not meet criteria for psychiatric inpatient admission. Supportive therapy provided about ongoing stressors. Discussed crisis plan, support from social network, calling 911, coming to the Emergency Department, and calling Suicide Hotline.  Alethia Berthold, MD 04/11/2018 6:17 PM

## 2018-04-11 NOTE — Patient Instructions (Addendum)
Go from here to the emergency room Please do stop the vaping Start the vitamin D once a week for eight weeks, then 800 iu of over-the-counter vitamin D3 daily Start the vitamin B12 daily (take that under the tongue) Close follow-up here in 1-2 weeks, but please don't miss a psychiatry appointment to come here  12 Ways to Curb Anxiety  ?Anxiety is normal human sensation. It is what helped our ancestors survive the pitfalls of the wilderness. Anxiety is defined as experiencing worry or nervousness about an imminent event or something with an uncertain outcome. It is a feeling experienced by most people at some point in their lives. Anxiety can be triggered by a very personal issue, such as the illness of a loved one, or an event of global proportions, such as a refugee crisis. Some of the symptoms of anxiety are:  Feeling restless.  Having a feeling of impending danger.  Increased heart rate.  Rapid breathing. Sweating.  Shaking.  Weakness or feeling tired.  Difficulty concentrating on anything except the current worry.  Insomnia.  Stomach or bowel problems. What can we do about anxiety we may be feeling? There are many techniques to help manage stress and relax. Here are 12 ways you can reduce your anxiety almost immediately: 1. Turn off the constant feed of information. Take a social media sabbatical. Studies have shown that social media directly contributes to social anxiety.  2. Monitor your television viewing habits. Are you watching shows that are also contributing to your anxiety, such as 24-hour news stations? Try watching something else, or better yet, nothing at all. Instead, listen to music, read an inspirational book or practice a hobby. 3. Eat nutritious meals. Also, don't skip meals and keep healthful snacks on hand. Hunger and poor diet contributes to feeling anxious. 4. Sleep. Sleeping on a regular schedule for at least seven to eight hours a night will do wonders for your  outlook when you are awake. 5. Exercise. Regular exercise will help rid your body of that anxious energy and help you get more restful sleep. 6. Try deep (diaphragmatic) breathing. Inhale slowly through your nose for five seconds and exhale through your mouth. 7. Practice acceptance and gratitude. When anxiety hits, accept that there are things out of your control that shouldn't be of immediate concern.  8. Seek out humor. When anxiety strikes, watch a funny video, read jokes or call a friend who makes you laugh. Laughter is healing for our bodies and releases endorphins that are calming. 9. Stay positive. Take the effort to replace negative thoughts with positive ones. Try to see a stressful situation in a positive light. Try to come up with solutions rather than dwelling on the problem. 10. Figure out what triggers your anxiety. Keep a journal and make note of anxious moments and the events surrounding them. This will help you identify triggers you can avoid or even eliminate. 11. Talk to someone. Let a trusted friend, family member or even trained professional know that you are feeling overwhelmed and anxious. Verbalize what you are feeling and why.  12. Volunteer. If your anxiety is triggered by a crisis on a large scale, become an advocate and work to resolve the problem that is causing you unease. Anxiety is often unwelcome and can become overwhelming. If not kept in check, it can become a disorder that could require medical treatment. However, if you take the time to care for yourself and avoid the triggers that make you anxious, you  will be able to find moments of relaxation and clarity that make your life much more enjoyable.    Suicidal Feelings: How to Help Yourself Suicide is the taking of one's own life. If you feel as though life is getting too tough to handle and are thinking about suicide, get help right away. To get help:  Call your local emergency services (911 in the [place]  Call  a suicide hotline to speak with a trained counselor who understands how you are feeling. The following is a list of suicide hotlines in the [place]. For a list of hotlines in [place], visit InkDistributor.it. ? 1-800-273-TALK 210-860-4957). ? 1-800-SUICIDE (318) 578-6848). ? (717)646-7855. This is a hotline for Spanish speakers. ? 1-800-799-4TTY 201-829-4172). This is a hotline for TTY users. ? 1-866-4-U-TREVOR (559) 425-7476). This is a hotline for lesbian, gay, bisexual, transgender, or questioning youth.  Contact a crisis center or a local suicide prevention center. To find a crisis center or suicide prevention center: ? Call your local hospital, clinic, community service organization, mental health center, social service provider, or health department. Ask for assistance in connecting to a crisis center. ? Visit https://www.patel-king.com/ for a list of crisis centers in the [place], or visit www.suicideprevention.ca/thinking-about-suicide/find-a-crisis-centre for a list of centers in [place].  Visit the following websites: ? National Suicide Prevention Lifeline: www.suicidepreventionlifeline.org ? Hopeline: www.hopeline.com ? [employer] for Suicide Prevention: https://www.ayers.com/ ? [employer] (for lesbian, gay, bisexual, transgender, or questioning youth): www.thetrevorproject.org  How can I help myself feel better?  Promise yourself that you will not do anything drastic when you have suicidal feelings. Remember, there is hope. Many people have gotten through suicidal thoughts and feelings, and you will, too. You may have gotten through them before, and this proves that you can get through them again.  Let family, friends, teachers, or counselors know how you are feeling. Try not to isolate yourself from those who care about you. Remember, they will want to help you. Talk with someone  every day, even if you do not feel sociable. Face-to-face conversation is best.  Call a mental health professional and see one regularly.  Visit your primary health care provider every year.  Eat a well-balanced diet, and space your meals so you eat regularly.  Get plenty of rest.  Avoid alcohol and drugs, and remove them from your home. They will only make you feel worse.  If you are thinking of taking a lot of medicine, give your medicine to someone who can give it to you one day at a time. If you are on antidepressants and are concerned you will overdose, let your health care provider know so he or she can give you safer medicines. Ask your mental health professional about the possible side effects of any medicines you are taking.  Remove weapons, poisons, knives, and anything else that could harm you from your home.  Try to stick to routines. Follow a schedule every day. Put self-care on your schedule.  Make a list of realistic goals, and cross them off when you achieve them. Accomplishments give a sense of worth.  Wait until you are feeling better before doing the things you find difficult or unpleasant.  Exercise if you are able. You will feel better if you exercise for even a half hour each day.  Go out in the sun or into nature. This will help you recover from depression faster. If you have a favorite place to walk, go there.  Do the things that have  always given you pleasure. Play your favorite music, read a good book, paint a picture, play your favorite instrument, or do anything else that takes your mind off your depression if it is safe to do.  Keep your living space well lit.  When you are feeling well, write yourself a letter about tips and support that you can read when you are not feeling well.  Remember that life's difficulties can be sorted out with help. Conditions can be treated. You can work on thoughts and strategies that serve you well. This information is not  intended to replace advice given to you by your health care provider. Make sure you discuss any questions you have with your health care provider. Document Released: 11/19/2002 Document Revised: 01/12/2016 Document Reviewed: 09/09/2013 Elsevier Interactive Patient Education  [employer].

## 2018-04-11 NOTE — ED Notes (Signed)
This tech dressed out pt into burgundy scrubs. Belongings include: black shoes, yellow socks, black shirt, blue jeans, pink bra, pink underwear, silver earrings, brown hair tie and green jacket. Pt informed this tech that she would leave her clothes here but her purse is to go with her mother, Jefm PettyChristina Flock.

## 2018-04-11 NOTE — Assessment & Plan Note (Signed)
Start supplement; may be exacerbating situation

## 2018-04-11 NOTE — ED Notes (Signed)
BEHAVIORAL HEALTH ROUNDING Patient sleeping: No. Patient alert and oriented: yes Behavior appropriate: Yes.  ; If no, describe:  Nutrition and fluids offered: yes Toileting and hygiene offered: Yes  Sitter present: q15 minute observations and security  monitoring Law enforcement present: Yes  ODS  

## 2018-04-11 NOTE — ED Notes (Signed)
Meal provided, patient going to eat in a little while states the patient.

## 2018-04-11 NOTE — ED Notes (Signed)
Information provided to her at time of discharge to call psychiatrist office where she missed an appointment last week  - psychiatrist office agrees with [facility] to reschedule appointment  [phone]

## 2018-04-11 NOTE — Assessment & Plan Note (Signed)
Sending to the ER now; mother going with her

## 2018-04-11 NOTE — Progress Notes (Signed)
BP 102/64   Pulse 74   Temp 98 F (36.7 C)   Ht 5\' 3"  (1.6 m)   Wt 123 lb 12.8 oz (56.2 kg)   SpO2 98%   BMI 21.93 kg/m    Subjective:    Patient ID: Lindsey Norman, female    DOB: 14-Dec-2000, 17 y.o.   MRN: [id number]  HPI: Lindsey Norman is a 17 y.o. female  Chief Complaint  Patient presents with  . Follow-up    HPI  She is here for f/u She has struggled with depression for a few years This current bout of depression has been going on for a few months, getting more severe the last 2-3 weeks Was feeling really anxious at first, not sleeping well; just could not sleep until 4 am Patient was up until 2 am last night, then she got upset and went for a drive last night and came home, then finally fell asleep around 6 am; tired right now No cutting behavior She tried make herself vomit when she was younger, but couldn't so quit Definitely has eaten less at times; sometimes not hungry from the anxiety Patient lives with her mother; will move out in the summer No guns in the home She is currently a waittress Taking time off to save money and then starting school in the spring semester 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 Occasionally drinks alcohol but does not use it to deal with anxiety or depression Source of marijuana is trusted; no unsual highs On feet a lot at work  Runs in the family No one has successfully completed suicide She thoughts about suicide more than half the days of the week No medicine for depression She does not want to take anything Her father has depression and has tried almost every medicine on the books Her mother was on anxiety medicine frequently and she reacted differently No one is with her today Missed psychiatrist appointment earlier this week Mother has anxiety Father has unipolar depression Younger brother is 30 years old No known eating disorders Alcoholism on mother's side; maybe drug use  Vitamin B12 was low  323 back in Feb 2019; not taking any supplements Vitamin D was low  Depression screen [facility] 2/9 04/11/2018 07/20/2017  Decreased Interest 2 2  Down, Depressed, Hopeless 2 1  PHQ - 2 Score 4 3  Altered sleeping 2 2  Tired, decreased energy 2 3  Change in appetite 1 2  Feeling bad or failure about yourself  1 2  Trouble concentrating 1 2  Moving slowly or fidgety/restless 1 3  Suicidal thoughts 2 1  PHQ-9 Score 14 18  Difficult doing work/chores Very difficult Somewhat difficult   Fall Risk  04/11/2018 03/25/2018 03/19/2018 07/20/2017  Falls in the past year? 0 No No Yes  Number falls in past yr: - - - 1  Injury with Fall? - - - No    Relevant past medical, surgical, family and social history reviewed History reviewed. No pertinent past medical history. History reviewed. No pertinent surgical history. Family History  Problem Relation Age of Onset  . COPD Mother        related to smoking  . Anxiety disorder Mother   . Depression Mother   . Mental illness Mother        anxiety  . Depression Father   . Mental illness Father        anxiety  . Cancer Maternal Grandfather  lung   Social History   Tobacco Use  . Smoking status: Current Every Day Smoker    Types: E-cigarettes    Start date: 11/26/2017  . Smokeless tobacco: Never Used  Substance Use Topics  . Alcohol use: No    Frequency: Never    Comment: but has in past occasiona  . Drug use: No    Comment: in past marijuana     Office Visit from 04/11/2018 in [facility] - Eat  AUDIT-C Score  2      Interim medical history since last visit reviewed. Allergies and medications reviewed  Review of Systems Per HPI unless specifically indicated above     Objective:    BP 102/64   Pulse 74   Temp 98 F (36.7 C)   Ht 5\' 3"  (1.6 m)   Wt 123 lb 12.8 oz (56.2 kg)   SpO2 98%   BMI 21.93 kg/m   Wt Readings from Last 3 Encounters:  04/11/18 123 lb 12.8 oz (56.2 kg) (45 %, Z= -0.12)*  03/25/18  116 lb 3.2 oz (52.7 kg) (29 %, Z= -0.54)*  03/19/18 122 lb 6.4 oz (55.5 kg) (43 %, Z= -0.19)*   * Growth percentiles are based on CDC (Girls, 2-20 Years) data.    Physical Exam  Constitutional: She appears well-developed and well-nourished.  HENT:  Mouth/Throat: Mucous membranes are normal.  Eyes: EOM are normal. No scleral icterus.  Cardiovascular: Normal rate.  Pulmonary/Chest: Effort normal.  Psychiatric: Her affect is blunt. Her affect is not labile and not inappropriate. She exhibits a depressed mood.   Results for orders placed or performed during the hospital encounter of 03/19/18  CBC with Differential  Result Value Ref Range   WBC 7.3 4.0 - 10.5 K/uL   RBC 4.55 3.87 - 5.11 MIL/uL   Hemoglobin 13.3 12.0 - 15.0 g/dL   HCT 40.9 81.1 - 91.4 %   MCV 89.2 80.0 - 100.0 fL   MCH 29.2 26.0 - 34.0 pg   MCHC 32.8 30.0 - 36.0 g/dL   RDW 78.2 95.6 - 21.3 %   Platelets 300 150 - 400 K/uL   nRBC 0.0 0.0 - 0.2 %   Neutrophils Relative % 57 %   Neutro Abs 4.2 1.7 - 7.7 K/uL   Lymphocytes Relative 30 %   Lymphs Abs 2.2 0.7 - 4.0 K/uL   Monocytes Relative 11 %   Monocytes Absolute 0.8 0.1 - 1.0 K/uL   Eosinophils Relative 2 %   Eosinophils Absolute 0.1 0.0 - 0.5 K/uL   Basophils Relative 0 %   Basophils Absolute 0.0 0.0 - 0.1 K/uL   Smear Review PLATELETS APPEAR ADEQUATE    Immature Granulocytes 0 %   Abs Immature Granulocytes 0.01 0.00 - 0.07 K/uL   Dimorphism PRESENT   Basic Metabolic Panel (BMET)  Result Value Ref Range   Sodium 138 135 - 145 mmol/L   Potassium 4.3 3.5 - 5.1 mmol/L   Chloride 102 98 - 111 mmol/L   CO2 28 22 - 32 mmol/L   Glucose, Bld 98 70 - 99 mg/dL   BUN 10 6 - 20 mg/dL   Creatinine, Ser 0.86 0.44 - 1.00 mg/dL   Calcium 57.8 8.9 - 46.9 mg/dL   GFR calc non Af Amer >60 >60 mL/min   GFR calc Af Amer >60 >60 mL/min   Anion gap 8 5 - 15      Assessment & Plan:   Problem List Items Addressed This Visit  Other   Vitamin D deficiency    Start  back on supplement; vit D deficiency may be exacerbating situation      Vitamin B12 deficiency    Start supplement; may be exacerbating situation      Depression, major, recurrent (HCC)    Sending to the ER now; mother going with her       Other Visit Diagnoses    Suicidal ideation    -  Primary   patient agrees to go voluntarily to the ER for evaluation, psychiatric assessment, with close f/u hopefully with psych afterwards      Follow up plan: No follow-ups on file.  An after-visit summary was printed and given to the patient at check-out.  Please see the patient instructions which may contain other information and recommendations beyond what is mentioned above in the assessment and plan.  Meds ordered this encounter  Medications  . Vitamin D, Ergocalciferol, (DRISDOL) 1.25 MG (50000 UT) CAPS capsule    Sig: Take 1 capsule (50,000 Units total) by mouth every 7 (seven) days.    Dispense:  4 capsule    Refill:  1  . Cyanocobalamin (VITAMIN B-12) 500 MCG SUBL    Sig: One under tongue once a day    Dispense:  30 tablet    Refill:  2    No orders of the defined types were placed in this encounter.   Report called to Irving Burtonmily in the ER Patient left with mother, mother will drive, going by private vehicle, voluntary

## 2018-04-11 NOTE — Assessment & Plan Note (Addendum)
Start back on supplement; vit D deficiency may be exacerbating situation

## 2018-05-03 ENCOUNTER — Other Ambulatory Visit: Payer: Self-pay | Admitting: Family Medicine

## 2018-12-03 ENCOUNTER — Other Ambulatory Visit: Payer: Self-pay | Admitting: *Deleted

## 2018-12-03 DIAGNOSIS — Z20822 Contact with and (suspected) exposure to covid-19: Secondary | ICD-10-CM

## 2018-12-09 LAB — NOVEL CORONAVIRUS, NAA: SARS-CoV-2, NAA: NOT DETECTED

## 2019-08-12 ENCOUNTER — Ambulatory Visit: Payer: Self-pay | Admitting: *Deleted

## 2019-08-12 NOTE — Telephone Encounter (Signed)
Pt's step mother initiating call, pt present. NT spoke with pt, step mother on speaker phone.  Pt states stopped Prozac yesterday due to "Increased depression, anxiety dissociation past 2 weeks. Didn't like the Prozac." Last took yesterday AM.  Pt reports "More depressed lately" Denies suicidal ideation presently but states she had thoughts several days ago. No homicidal ideation. States trying getting in touch with psychiatrist but could not reach. States she has appt with her this Thursday.  Step mother states she just wanted someone to know pt had stopped prozac, would like pt to try another medication. Advised pt should not stop abruptly; pt stated she will take 1/2 dose tonight. Pt calm during call, voice clear and non halting. Emotional support given. Advised to stay with step mother, pt stated she lives with 2 friends "Who know what's going on and I really need to get home." Step mother states she is OK with decision. Behavioral Health number given as resource. Advised ED if symptoms worsen, ANY suicidal thoughts. Pt agrees and verbalizes understanding. Assured pt NT would route to practice for review. Pt aware Dr. Sherie Don is no longer at practice but would like to stay with group, another provider.    After hours call.  Please advise: [phone]  Reason for Disposition . [1] Depression AND [2] worsening (e.g.,sleeping poorly, less able to do activities of daily living)  Answer Assessment - Initial Assessment Questions 1. CONCERN: "What happened that made you call today?"     "Stopped Prozac yesterday, my step mother is concerned." 2. DEPRESSION SYMPTOM SCREENING: "How are you feeling overall?" (e.g., decreased energy, increased sleeping or difficulty sleeping, difficulty concentrating, feelings of sadness, guilt, hopelessness, or worthlessness)     "Better today but felt deeped depression, anxiety last 2 weeks" 3. RISK OF HARM - SUICIDAL IDEATION:  "Do you ever have thoughts of hurting or  killing yourself?"  (e.g., yes, no, no but preoccupation with thoughts about death)   - INTENT:  "Do you have thoughts of hurting or killing yourself right NOW?" (e.g., yes, no, N/A)   - PLAN: "Do you have a specific plan for how you would do this?" (e.g., gun, knife, overdose, no plan, N/A)     "Not now but I have, few days ago." 4. RISK OF HARM - HOMICIDAL IDEATION:  "Do you ever have thoughts of hurting or killing someone else?"  (e.g., yes, no, no but preoccupation with thoughts about death)   - INTENT:  "Do you have thoughts of hurting or killing someone right NOW?" (e.g., yes, no, N/A)   - PLAN: "Do you have a specific plan for how you would do this?" (e.g., gun, knife, no plan, N/A)      no 5. FUNCTIONAL IMPAIRMENT: "How have things been going for you overall in your life? Have you had any more difficulties than usual doing your normal daily activities?"  (e.g., better, same, worse; self-care, school, work, interactions)      6. SUPPORT: "Who is with you now?" "Who do you live with?" "Do you have family or friends nearby who you can talk to?"      With step mother presently, lies with 2 friends 7. THERAPIST: "Do you have a counselor or therapist? Name?"     Yes 8. STRESSORS: "Has there been any new stress or recent changes in your life?"     9. DRUG ABUSE/ALCOHOL: "Do you drink alcohol or use any illegal drugs?"      no 10. OTHER: "Do you have  any other health or medical symptoms right now?" (e.g., fever)      no  Protocols used: DEPRESSION-A-AH

## 2019-08-13 NOTE — Telephone Encounter (Signed)
Tried to call pt at least 4x throughout the day with no answer and no vm setup

## 2019-08-13 NOTE — Telephone Encounter (Signed)
Tried to reach out to patient cell # does not work and home # has no VM setup.  Will try again later.

## 2019-09-08 ENCOUNTER — Emergency Department (HOSPITAL_COMMUNITY)
Admission: EM | Admit: 2019-09-08 | Discharge: 2019-09-09 | Disposition: A | Discharge status: Home / Self Care | Payer: BC Managed Care – PPO | Attending: Emergency Medicine | Admitting: Emergency Medicine

## 2019-09-08 ENCOUNTER — Emergency Department (HOSPITAL_COMMUNITY): Payer: BC Managed Care – PPO

## 2019-09-08 DIAGNOSIS — Z5321 Procedure and treatment not carried out due to patient leaving prior to being seen by health care provider: Secondary | ICD-10-CM | POA: Insufficient documentation

## 2019-09-08 DIAGNOSIS — R69 Illness, unspecified: Secondary | ICD-10-CM | POA: Diagnosis not present
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
## 2019-09-09 NOTE — ED Notes (Signed)
Pt called for vitals x3 no response 

## 2021-07-01 IMAGING — CR DG HAND COMPLETE 3+V*L*
3 series · 3 of 3 positions shown · non-contrast
Comparison: None.

CLINICAL DATA: Hand laceration

EXAM:
LEFT HAND - COMPLETE 3+ VIEW

[hand pa]
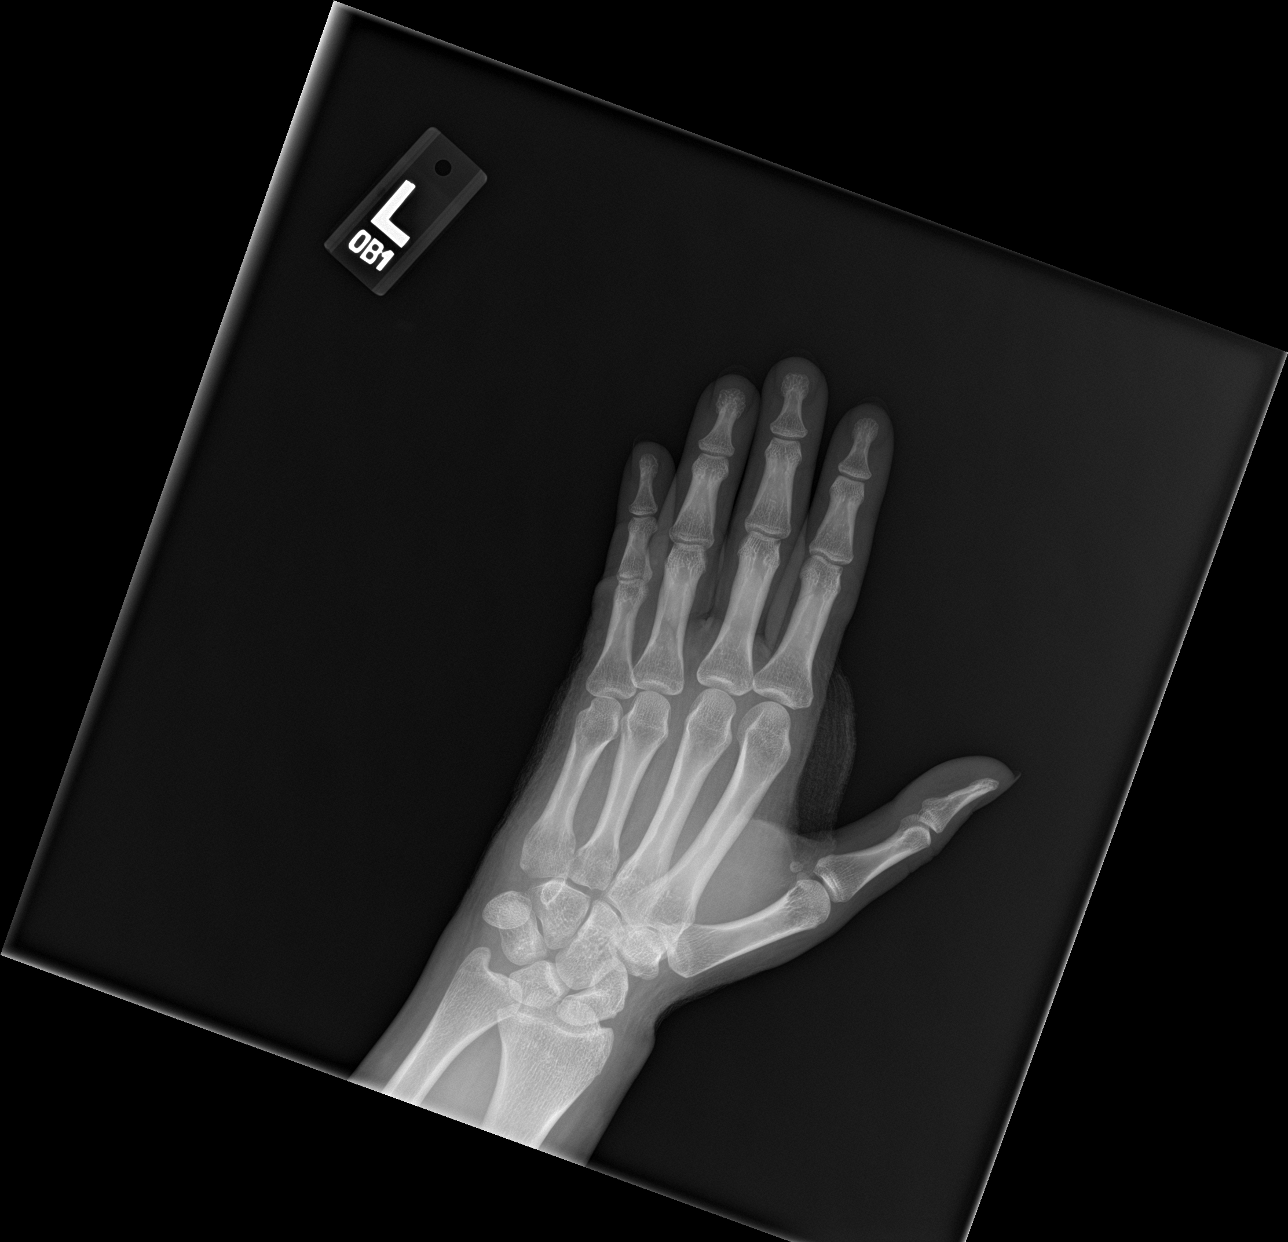

[hand obl]
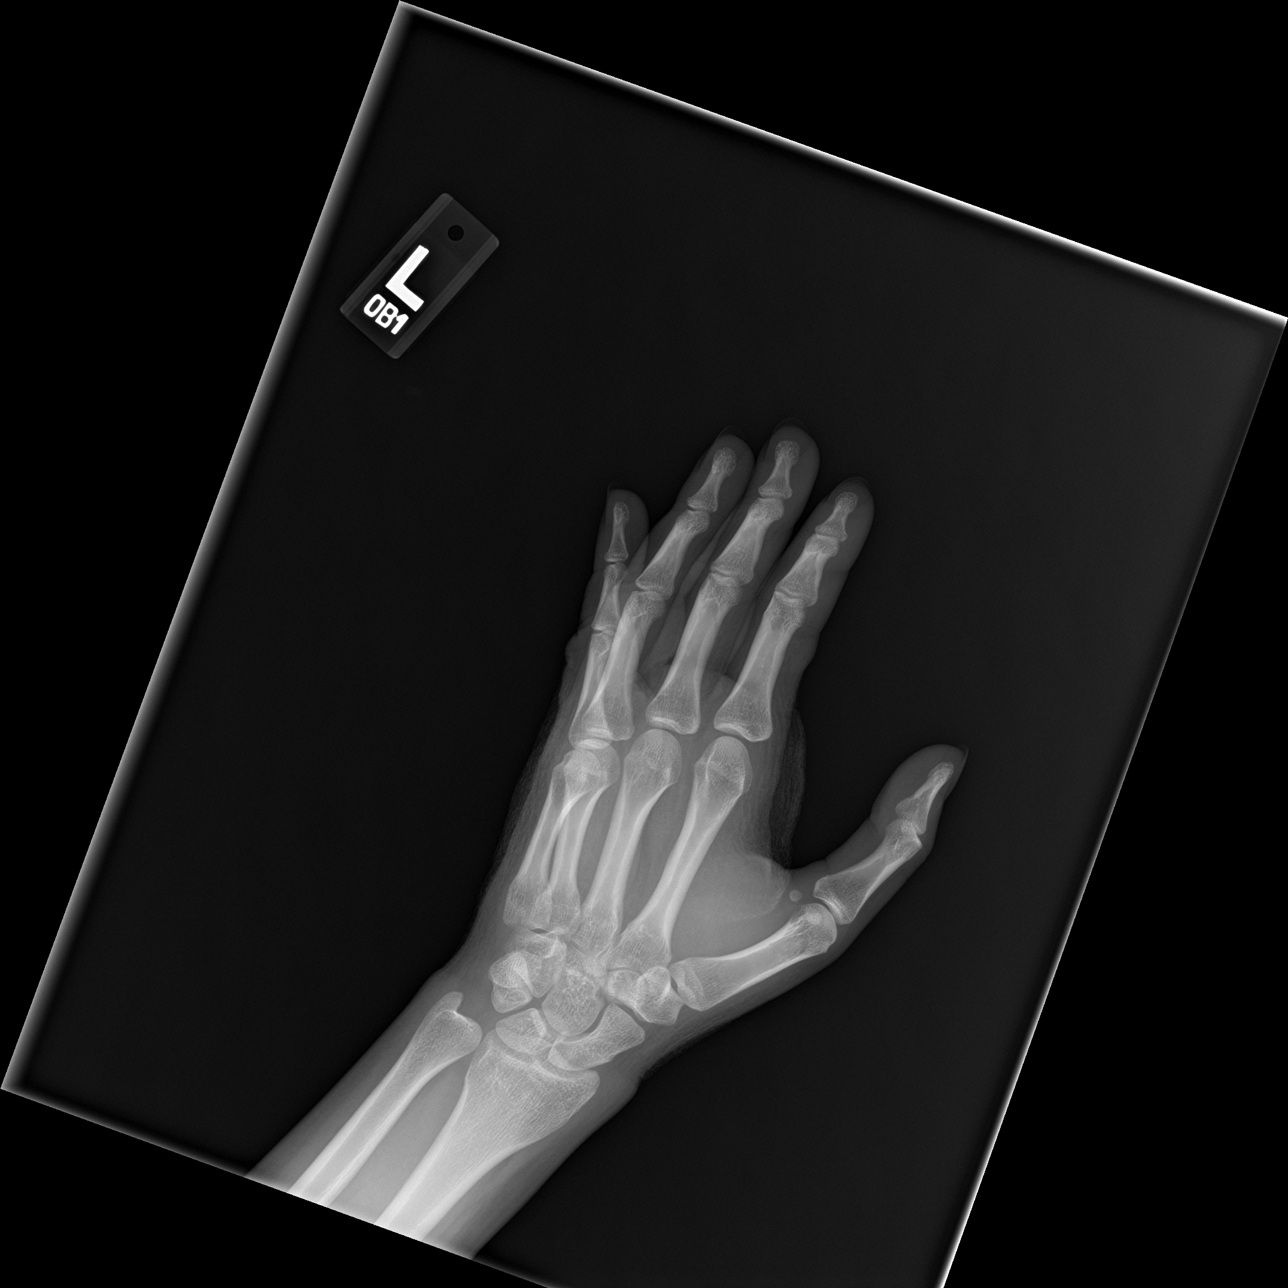

[hand lat]
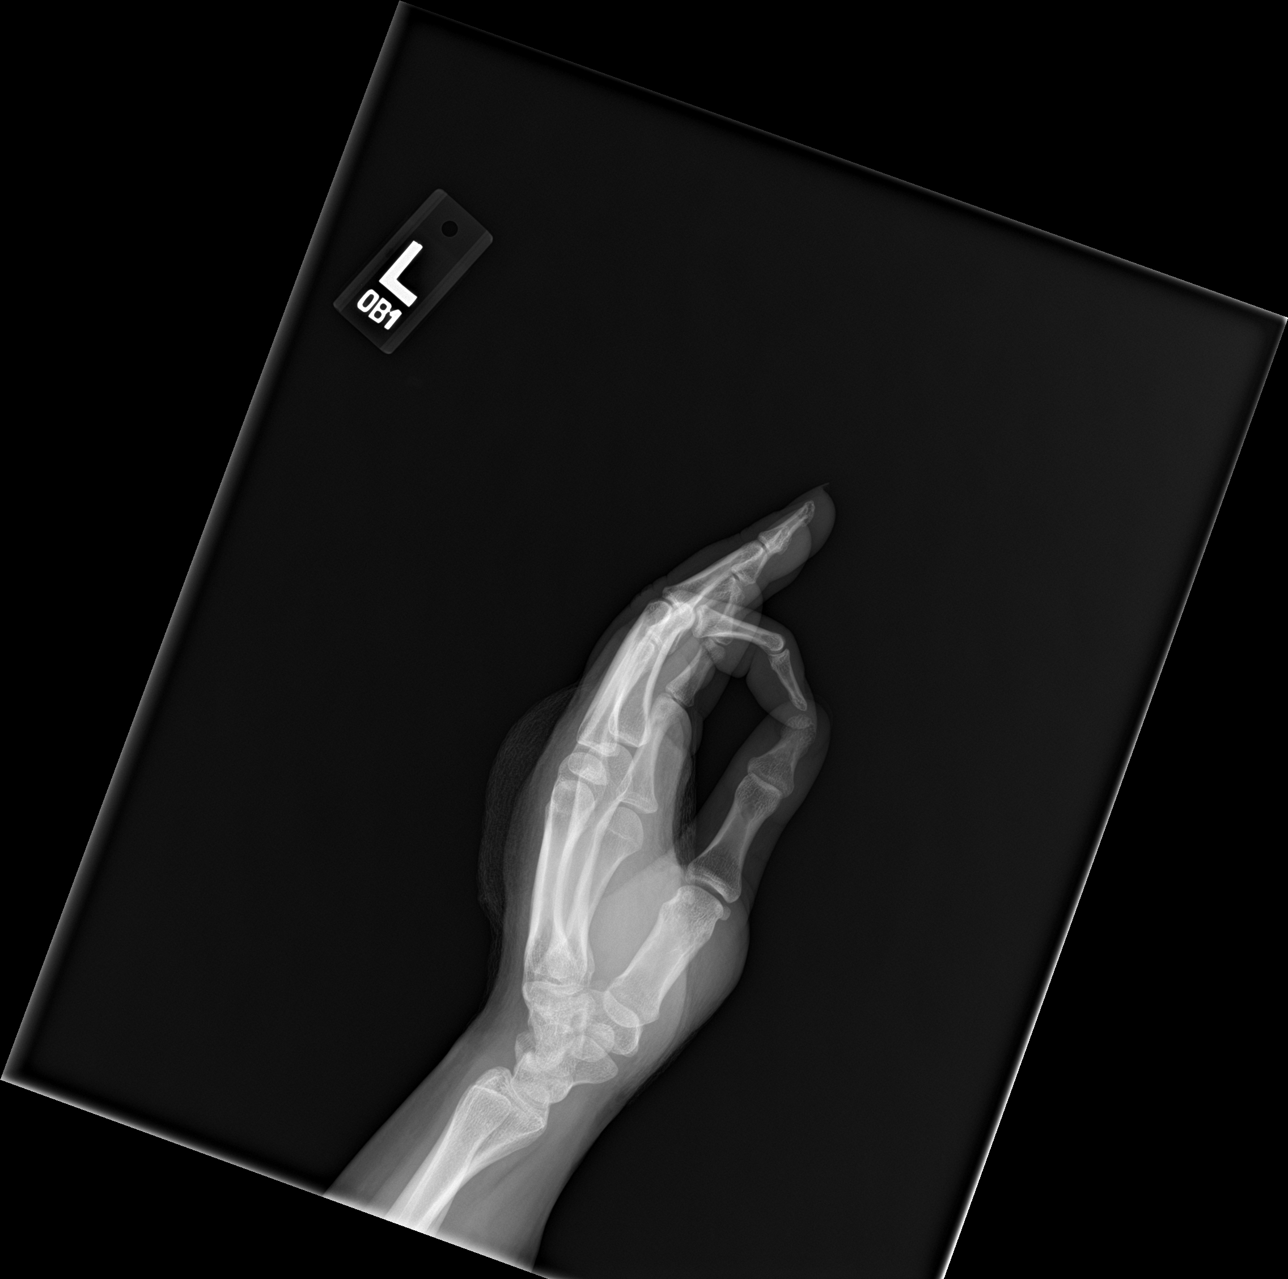

[3 of 3 positions shown; findings below may reference images not displayed]

FINDINGS: There is no evidence of fracture or dislocation. There is no
evidence of arthropathy or other focal bone abnormality. Soft
tissues are unremarkable.
IMPRESSION: Negative.

## 2021-10-21 ENCOUNTER — Encounter (HOSPITAL_BASED_OUTPATIENT_CLINIC_OR_DEPARTMENT_OTHER): Payer: Self-pay

## 2021-10-21 ENCOUNTER — Other Ambulatory Visit: Payer: Self-pay

## 2021-10-21 DIAGNOSIS — M542 Cervicalgia: Secondary | ICD-10-CM | POA: Diagnosis not present

## 2021-10-21 DIAGNOSIS — R079 Chest pain, unspecified: Secondary | ICD-10-CM | POA: Diagnosis present

## 2021-10-21 DIAGNOSIS — M549 Dorsalgia, unspecified: Secondary | ICD-10-CM | POA: Insufficient documentation

## 2021-10-21 DIAGNOSIS — R0789 Other chest pain: Secondary | ICD-10-CM | POA: Diagnosis not present
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
## 2021-10-22 ENCOUNTER — Emergency Department (HOSPITAL_BASED_OUTPATIENT_CLINIC_OR_DEPARTMENT_OTHER)
Admission: EM | Admit: 2021-10-22 | Discharge: 2021-10-22 | Disposition: A | Discharge status: Home / Self Care | Payer: BC Managed Care – PPO | Attending: Emergency Medicine | Admitting: Emergency Medicine

## 2021-10-22 ENCOUNTER — Emergency Department (HOSPITAL_BASED_OUTPATIENT_CLINIC_OR_DEPARTMENT_OTHER): Payer: BC Managed Care – PPO | Admitting: Radiology

## 2021-10-22 DIAGNOSIS — R0789 Other chest pain: Secondary | ICD-10-CM

## 2021-10-22 MED ORDER — IBUPROFEN 400 MG PO TABS
600.0000 mg | ORAL_TABLET | Freq: Once | ORAL | Status: AC
  Administered 2021-10-22: 600 mg via ORAL
  Filled 2021-10-22: qty 1
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
## 2023-05-30 NOTE — L&D Delivery Note (Signed)
 OB/GYN Faculty Practice Delivery Note  Lindsey Norman is a 23 y.o. G1P0000 s/p NSVD at [redacted]w[redacted]d. She was admitted for spontaneous onset of labor.   ROM: 0h 34m with clear fluid GBS Status: Positive Maximum Maternal Temperature: 98.27F  Labor Progress: AROM 0052 clear fluid  Delivery Date/Time: 02/15/24 at 0124 Delivery: Called to room and patient was complete and pushing. Head delivered . No nuchal cord present. Shoulder and body delivered in usual fashion. Infant with spontaneous cry, placed on mother's abdomen, dried and stimulated. Cord clamped x 2 after 1-minute delay, and cut by FOB. Cord blood drawn. Placenta delivered spontaneously with gentle cord traction. Fundus firm with massage and Pitocin . Labia, perineum, vagina, and cervix inspected and intact.   Placenta: intact Complications: None Lacerations: None EBL: 60 Analgesia:  Nitrous Oxide  Postpartum Planning [ ]  message to sent to schedule follow-up  [ ]  vaccines UTD  Infant: Female  APGARs 9/9  pending  Makenah Karas N Latonja Bobeck, Student-MidWife  02/15/2024 2:00 AM

## 2023-06-20 ENCOUNTER — Ambulatory Visit (HOSPITAL_COMMUNITY)
Admission: EM | Admit: 2023-06-20 | Discharge: 2023-06-20 | Disposition: A | Discharge status: Home / Self Care | Payer: Self-pay | Attending: Family Medicine | Admitting: Family Medicine

## 2023-06-20 ENCOUNTER — Encounter (HOSPITAL_COMMUNITY): Payer: Self-pay

## 2023-06-20 DIAGNOSIS — Z3201 Encounter for pregnancy test, result positive: Secondary | ICD-10-CM

## 2023-06-20 LAB — POCT URINE PREGNANCY: Preg Test, Ur: POSITIVE — AB

## 2023-06-20 MED ORDER — PRENATAL COMPLETE 14-0.4 MG PO TABS: ORAL_TABLET | ORAL | 2 refills | Status: AC

## 2023-06-20 NOTE — ED Triage Notes (Addendum)
Patient is requesting a pregnancy test.  Patient states she has been having abdominal cramping x 1 week. Rates 3/10.

## 2023-06-21 NOTE — ED Provider Notes (Signed)
[facility] CARE CENTER   [id number] 06/20/23 Arrival Time: 1759  ASSESSMENT & PLAN:  1. Positive pregnancy test    Results for orders placed or performed during the hospital encounter of 06/20/23  POCT urine pregnancy   Collection Time: 06/20/23  7:28 PM  Result Value Ref Range   Preg Test, Ur Positive (A) Negative    Discharge Medication List as of 06/20/2023  7:47 PM     START taking these medications   Details  Prenatal Vit-Fe Fumarate-FA (PRENATAL COMPLETE) 14-0.4 MG TABS Take one by mouth daily., Normal       Recommend:  Follow-up Information     Schedule an appointment as soon as possible for a visit  with [facility].   Specialty: Obstetrics and Gynecology Contact information: [address] [place] [address] [phone]                Reviewed expectations re: course of current medical issues. Questions answered. Outlined signs and symptoms indicating need for more acute intervention. Understanding verbalized. After Visit Summary given.   SUBJECTIVE: History from: Patient. Lindsey Norman is a 23 y.o. female. Requests preg test. Patient's last menstrual period was 05/20/2023 (approximate). Mild lower abd cramping this week; none currently. Denies vaginal bleeding/n/v. Denies urinary symptoms. Normal PO intake without n/v/d.  OBJECTIVE:  Vitals:   06/20/23 1857  BP: 115/73  Pulse: 65  Resp: 14  Temp: 98.2 F (36.8 C)  TempSrc: Oral  SpO2: 99%    General appearance: alert; no distress Abd: soft Skin: warm and dry Psychological: alert and cooperative; normal mood and affect  Labs: Results for orders placed or performed during the hospital encounter of 06/20/23  POCT urine pregnancy   Collection Time: 06/20/23  7:28 PM  Result Value Ref Range   Preg Test, Ur Positive (A) Negative   Labs Reviewed  POCT URINE PREGNANCY - Abnormal; Notable for the following  components:      Result Value   Preg Test, Ur Positive (*)    All other components within normal limits    Imaging: No results found.  Allergies  Allergen Reactions   Penicillins Rash    Past Medical History:  Diagnosis Date   Anxiety    Depression    Social History   Socioeconomic History   Marital status: Single    Spouse name: Not on file   Number of children: Not on file   Years of education: Not on file   Highest education level: Not on file  Occupational History   Not on file  Tobacco Use   Smoking status: Former    Types: E-cigarettes   Smokeless tobacco: Never  Vaping Use   Vaping status: Former  Substance and Sexual Activity   Alcohol use: Yes    Comment: but has in past occasiona   Drug use: Yes    Types: Marijuana    Comment: in past marijuana   Sexual activity: Yes    Birth control/protection: I.U.D.    Comment: kyleena  Other Topics Concern   Not on file  Social History Narrative   Not on file   Social Drivers of Health   Financial Resource Strain: Not on file  Food Insecurity: Not on file  Transportation Needs: Not on file  Physical Activity: Not on file  Stress: Not on file  Social Connections: Not on file  Intimate Partner Violence: Not on file   Family History  Problem  Relation Age of Onset   COPD Mother        related to smoking   Anxiety disorder Mother    Depression Mother    Mental illness Mother        anxiety   Depression Father    Mental illness Father        anxiety   Cancer Maternal Grandfather        lung   History reviewed. No pertinent surgical history.   Mardella Layman, MD 06/21/23 [phone]
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
## 2023-08-18 ENCOUNTER — Ambulatory Visit (HOSPITAL_COMMUNITY)
Admission: EM | Admit: 2023-08-18 | Discharge: 2023-08-18 | Disposition: A | Discharge status: Home / Self Care | Payer: Self-pay | Attending: Family Medicine | Admitting: Family Medicine

## 2023-08-18 ENCOUNTER — Encounter (HOSPITAL_COMMUNITY): Payer: Self-pay | Admitting: Emergency Medicine

## 2023-08-18 DIAGNOSIS — R350 Frequency of micturition: Secondary | ICD-10-CM

## 2023-08-18 LAB — POCT URINALYSIS DIP (MANUAL ENTRY)
Bilirubin, UA: NEGATIVE
Blood, UA: NEGATIVE
Glucose, UA: NEGATIVE mg/dL
Leukocytes, UA: NEGATIVE
Nitrite, UA: NEGATIVE
Protein Ur, POC: NEGATIVE mg/dL
Spec Grav, UA: 1.025
Urobilinogen, UA: 0.2 U/dL
pH, UA: 6

## 2023-08-18 LAB — POCT FASTING CBG KUC MANUAL ENTRY: POCT Glucose (KUC): 156 mg/dL — AB (ref 70–99)

## 2023-08-18 NOTE — Discharge Instructions (Signed)
 Keep your OB/Gyn follow up. Do your best to ensure adequate fluid intake in order to avoid dehydration. Your blood sugar was a little high today at 156. You may mention this to your [facility] provider when you see them.

## 2023-08-18 NOTE — ED Triage Notes (Signed)
 [redacted] weeks pregnant pt, c/o pain with urination and frequency for the past few days.

## 2023-08-20 NOTE — ED Provider Notes (Signed)
 [facility]    ASSESSMENT & PLAN:  1. Urinary frequency    U/A without signs of infection. Ensure adeq fluid intake.  Results for orders placed or performed during the hospital encounter of 08/18/23  POC urinalysis dipstick   Collection Time: 08/18/23  5:15 PM  Result Value Ref Range   Color, UA yellow    Clarity, UA clear    Glucose, UA negative mg/dL   Bilirubin, UA negative    Ketones, POC UA large (80) (A) mg/dL   Spec Grav, UA 4.098    Blood, UA negative    pH, UA 6.0    Protein Ur, POC negative mg/dL   Urobilinogen, UA 0.2 E.U./dL   Nitrite, UA Negative    Leukocytes, UA Negative   POC CBG monitoring   Collection Time: 08/18/23  5:28 PM  Result Value Ref Range   POCT Glucose ([facility]) 156 (A) 70 - 99 mg/dL      Discharge Instructions      Keep your OB/Gyn follow up. Do your best to ensure adequate fluid intake in order to avoid dehydration. Your blood sugar was a little high today at 156. You may mention this to your [facility] provider when you see them.     Has not seen OB yet. No signs of pyelonephritis. Outlined signs and symptoms indicating need for more acute intervention. Patient verbalized understanding. After Visit Summary given.  SUBJECTIVE:  Lindsey Norman is a 23 y.o. female  [redacted] weeks gestation who complains of urinary frequency; few days. Otherwise well. Denies abd/pelvic pain/vag bleeding. Ambulatory without difficulty. LMP: Patient's last menstrual period was 05/20/2023 (approximate).  OBJECTIVE:  Vitals:   08/18/23 1649  BP: 116/72  Pulse: 92  Resp: 18  Temp: 98.3 F (36.8 C)  TempSrc: Oral  SpO2: 100%   General appearance: alert; no distress HENT: oropharynx: moist Lungs: unlabored respirations Abdomen: soft Extremities: no edema; symmetrical with no gross deformities Skin: warm and dry Neurologic: normal gait Psychological: alert and cooperative; normal mood and affect  Labs Reviewed  POCT URINALYSIS DIP (MANUAL  ENTRY) - Abnormal; Notable for the following components:      Result Value   Ketones, POC UA large (80) (*)    All other components within normal limits  POCT FASTING CBG [facility] MANUAL ENTRY - Abnormal; Notable for the following components:   POCT Glucose ([facility]) 156 (*)    All other components within normal limits    Allergies  Allergen Reactions   Penicillins Rash    Past Medical History:  Diagnosis Date   Anxiety    Depression    Social History   Socioeconomic History   Marital status: Single    Spouse name: Not on file   Number of children: Not on file   Years of education: Not on file   Highest education level: Not on file  Occupational History   Not on file  Tobacco Use   Smoking status: Former    Types: E-cigarettes   Smokeless tobacco: Never  Vaping Use   Vaping status: Former  Substance and Sexual Activity   Alcohol use: Yes    Comment: but has in past occasiona   Drug use: Yes    Types: Marijuana    Comment: in past marijuana   Sexual activity: Yes    Birth control/protection: I.U.D.    Comment: kyleena  Other Topics Concern   Not on file  Social History Narrative   Not on file   Social Drivers  of Health   Financial Resource Strain: Not on file  Food Insecurity: Not on file  Transportation Needs: Not on file  Physical Activity: Not on file  Stress: Not on file  Social Connections: Not on file  Intimate Partner Violence: Not on file   Family History  Problem Relation Age of Onset   COPD Mother        related to smoking   Anxiety disorder Mother    Depression Mother    Mental illness Mother        anxiety   Depression Father    Mental illness Father        anxiety   Cancer Maternal Grandfather        lung        Mardella Layman, MD 08/20/23 [phone]

## 2023-09-24 LAB — OB RESULTS CONSOLE HIV ANTIBODY (ROUTINE TESTING): HIV: NONREACTIVE

## 2023-09-24 LAB — HEPATITIS C ANTIBODY: HCV Ab: NEGATIVE

## 2023-09-24 LAB — OB RESULTS CONSOLE GC/CHLAMYDIA
Chlamydia: NEGATIVE
Neisseria Gonorrhea: NEGATIVE

## 2023-09-24 LAB — OB RESULTS CONSOLE HEPATITIS B SURFACE ANTIGEN: Hepatitis B Surface Ag: NEGATIVE

## 2023-09-24 LAB — OB RESULTS CONSOLE RUBELLA ANTIBODY, IGM: Rubella: IMMUNE

## 2023-09-28 ENCOUNTER — Other Ambulatory Visit: Payer: Self-pay | Admitting: Family Medicine

## 2023-09-28 DIAGNOSIS — Z363 Encounter for antenatal screening for malformations: Secondary | ICD-10-CM

## 2023-10-02 ENCOUNTER — Other Ambulatory Visit: Payer: Self-pay | Admitting: Family Medicine

## 2023-10-02 DIAGNOSIS — N6320 Unspecified lump in the left breast, unspecified quadrant: Secondary | ICD-10-CM

## 2023-10-03 ENCOUNTER — Other Ambulatory Visit: Payer: Self-pay

## 2023-10-03 DIAGNOSIS — N6320 Unspecified lump in the left breast, unspecified quadrant: Secondary | ICD-10-CM

## 2023-10-10 ENCOUNTER — Encounter: Payer: Self-pay | Admitting: *Deleted

## 2023-10-19 ENCOUNTER — Ambulatory Visit (HOSPITAL_BASED_OUTPATIENT_CLINIC_OR_DEPARTMENT_OTHER): Payer: Self-pay

## 2023-10-19 ENCOUNTER — Other Ambulatory Visit: Payer: Self-pay | Admitting: *Deleted

## 2023-10-19 ENCOUNTER — Ambulatory Visit: Payer: Self-pay | Attending: Obstetrics and Gynecology | Admitting: Maternal & Fetal Medicine

## 2023-10-19 VITALS — BP 103/63 | HR 81

## 2023-10-19 DIAGNOSIS — O43192 Other malformation of placenta, second trimester: Secondary | ICD-10-CM | POA: Diagnosis not present

## 2023-10-19 DIAGNOSIS — Z3A22 22 weeks gestation of pregnancy: Secondary | ICD-10-CM | POA: Insufficient documentation

## 2023-10-19 DIAGNOSIS — Z363 Encounter for antenatal screening for malformations: Secondary | ICD-10-CM | POA: Diagnosis present

## 2023-10-19 NOTE — Progress Notes (Signed)
 Patient information  Patient Name: Lindsey Norman  Patient MRN:   [id number]  Referring practice: MFM Referring Provider: [facility] Department [facility])  MFM CONSULT  DAILY DOE is a 23 y.o. G1P0000 at [redacted]w[redacted]d here for ultrasound and consultation. 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 During today's visit we focused on the following concerns:   Marginal cord insertion Marginal cord insertion was seen on today's ultrasound. There are no other evident fetal or placental abnormalities and the placenta is well away from the internal os.  I discussed the diagnosis, management and prognosis of pregnancy associated with the marginal umbilical cord insertion in pregnancy.  Normally the umbilical cord inserts centrally into the placenta, however, with a marginal cord insertion the umbilical cord inserts within less than 2 cm from the placental edge.  This is also known as a battledore placenta.  It occurs in about 6% of singleton pregnancies but is as high as 10 to 15% in twin pregnancies. Marginal cord insertion is even more common in monochorionic twins, and it has been associated with an increased risk for an SGA newborn in monochorionic but not dichorionic twin pregnancies. In singleton pregnancies, there are associations with adverse centric outcomes such as  placental abruption (odds ratio 1.5), placenta previa (odds ratio 1.8), and small-for-gestational age (SGA) neonate (odds ratio 1.2), but not perinatal deaths. I explained that because of the potential for fetal growth restriction, it  is recommended that serial growth ultrasounds be performed during the pregnancy.  Sonographic findings Single intrauterine pregnancy at 22w 6d. Fetal cardiac activity:  Observed and appears normal. Presentation: Cephalic. The anatomic structures that were well seen appear normal without evidence of soft markers. The anatomic survey is complete.  Fetal biometry shows the estimated fetal weight at the 15 percentile. Amniotic fluid: Within normal limits.  MVP: 5.7 cm. Placenta: Posterior. Adnexa: No abnormality visualized. Cervical length: 3.9 cm.  There are limitations of prenatal ultrasound such as the inability to detect certain abnormalities due to poor visualization. Various factors such as fetal position, gestational age and maternal body habitus may increase the difficulty in visualizing the fetal anatomy.    Recommendations -EDD should be 02/16/2024 based on  LMP  (05/12/23) -Serial growth ultrasounds starting around 26 weeks to monitor for fetal growth restriction -Delivery timing pending clinical course -Continue routine prenatal care with referring OB provider  Review of Systems: A review of systems was performed and was negative except per HPI   Vitals and Physical Exam    10/19/2023    7:15 AM 08/18/2023    4:49 PM 06/20/2023    6:57 PM  Vitals with BMI  Systolic 103 116 191  Diastolic 63 72 73  Pulse 81 92 65    Sitting comfortably on the sonogram table Nonlabored breathing Normal rate and rhythm Abdomen is nontender  Past pregnancies OB History  Gravida Para Term Preterm AB Living  1 0 0 0 0 0  SAB IAB Ectopic Multiple Live Births  0 0 0 0 0    # Outcome Date GA Lbr Len/2nd Weight Sex Type Anes  PTL Lv  1 Current             I spent 30 minutes reviewing the patients chart, including labs and images as well as counseling the patient about her medical conditions. Greater than 50% of the time was spent in direct face-to-face patient counseling.  Penney Bowling,  DO Maternal fetal medicine, [place]   10/19/2023  8:19 AM

## 2023-10-24 ENCOUNTER — Ambulatory Visit: Payer: Self-pay | Admitting: *Deleted

## 2023-10-24 VITALS — BP 107/71 | Wt 162.0 lb

## 2023-10-24 DIAGNOSIS — Z1239 Encounter for other screening for malignant neoplasm of breast: Secondary | ICD-10-CM

## 2023-10-24 DIAGNOSIS — N6325 Unspecified lump in the left breast, overlapping quadrants: Secondary | ICD-10-CM

## 2023-10-24 NOTE — Progress Notes (Signed)
 Ms. Lindsey Norman is a 23 y.o. female who presents to [facility] clinic today with complaint of left breast lump x 1 month.    Pap Smear: Pap smear not completed today. Last Pap smear was 09/24/2023 at [facility] clinic and was normal. Per patient has no history of an abnormal Pap smear. Last Pap smear result is available in Epic under referral notes.   Physical exam: Breasts Breasts symmetrical. No skin abnormalities bilateral breasts. No nipple retraction bilateral breasts. No nipple discharge bilateral breasts. No lymphadenopathy. No lumps palpated right breast. Palpated a lump within the left breast at 9 o'clock 6 cm from the nipple. Complaints of tenderness when palpated left breast lump.     Pelvic/Bimanual Pap is not indicated today per BCCCP guidelines.   Smoking History: Patient has is a former smoker that quit smoking cigarettes 2 years ago and marijuana pre-pregnancy.   Patient Navigation: Patient education provided. Access to services provided for patient through BCCCP program.    Breast and Cervical Cancer Risk Assessment: Patient has family history of a paternal great aunt having breast cancer. Patient has no known genetic mutations or history of radiation treatment to the chest before age 76. Patient does not have history of cervical dysplasia, immunocompromised, or DES exposure in-utero. Breast cancer risk assessment completed. No breast cancer risk calculated due to patient is less than 85 years old.  Risk Assessment   No risk assessment data     A: BCCCP exam without pap smear Complaint of left breast lump.  P: Referred patient to the [facility] for a left breast ultrasound. Appointment scheduled Thursday, Oct 25, 2023 at 1020.  Stefan Edge, RN 10/24/2023 11:33 AM

## 2023-10-24 NOTE — Patient Instructions (Signed)
 Explained breast self awareness with Lindsey Norman. Patient did not need a Pap smear today due to last Pap smear was 09/24/2023. Let her know BCCCP will cover Pap smears every 3 years unless has a history of abnormal Pap smears. Referred patient to the [facility] for a left breast ultrasound. Appointment scheduled Thursday, Oct 25, 2023 at 1020. Patient aware of appointment and will be there. Lindsey Norman verbalized understanding.  Renley Banwart, Dela Favor, RN 11:33 AM

## 2023-10-25 ENCOUNTER — Ambulatory Visit
Admission: RE | Admit: 2023-10-25 | Discharge: 2023-10-25 | Disposition: A | Discharge status: Home / Self Care | Payer: Self-pay | Source: Ambulatory Visit | Attending: Obstetrics and Gynecology | Admitting: Obstetrics and Gynecology

## 2023-10-25 DIAGNOSIS — N6320 Unspecified lump in the left breast, unspecified quadrant: Secondary | ICD-10-CM

## 2023-12-05 ENCOUNTER — Ambulatory Visit (HOSPITAL_BASED_OUTPATIENT_CLINIC_OR_DEPARTMENT_OTHER): Payer: Self-pay

## 2023-12-05 ENCOUNTER — Other Ambulatory Visit: Payer: Self-pay | Admitting: *Deleted

## 2023-12-05 ENCOUNTER — Ambulatory Visit: Attending: Maternal & Fetal Medicine | Admitting: Obstetrics and Gynecology

## 2023-12-05 VITALS — BP 115/64

## 2023-12-05 DIAGNOSIS — O43192 Other malformation of placenta, second trimester: Secondary | ICD-10-CM

## 2023-12-05 DIAGNOSIS — Z362 Encounter for other antenatal screening follow-up: Secondary | ICD-10-CM | POA: Diagnosis not present

## 2023-12-05 DIAGNOSIS — O43193 Other malformation of placenta, third trimester: Secondary | ICD-10-CM | POA: Diagnosis not present

## 2023-12-05 DIAGNOSIS — Z3A29 29 weeks gestation of pregnancy: Secondary | ICD-10-CM | POA: Diagnosis not present

## 2023-12-05 NOTE — Progress Notes (Signed)
 After review, MFM consult with provider is not indicated for today  Arna Ranks, MD 12/05/2023 4:43 PM  [facility]

## 2024-01-16 ENCOUNTER — Ambulatory Visit: Payer: Self-pay | Attending: Obstetrics and Gynecology

## 2024-01-16 ENCOUNTER — Ambulatory Visit (HOSPITAL_BASED_OUTPATIENT_CLINIC_OR_DEPARTMENT_OTHER): Payer: Self-pay | Admitting: Obstetrics

## 2024-01-16 VITALS — BP 115/70 | HR 90

## 2024-01-16 DIAGNOSIS — O43193 Other malformation of placenta, third trimester: Secondary | ICD-10-CM

## 2024-01-16 DIAGNOSIS — Z3A35 35 weeks gestation of pregnancy: Secondary | ICD-10-CM

## 2024-01-16 DIAGNOSIS — O43192 Other malformation of placenta, second trimester: Secondary | ICD-10-CM | POA: Insufficient documentation

## 2024-01-16 NOTE — Progress Notes (Signed)
 MFM Consult Note  Lindsey Norman is currently at 35 weeks and 4 days.  She has been followed due to a marginal placental cord insertion.    She denies any problems since her last exam.  On today's exam, the overall EFW of 5 pounds 13 ounces measures at the 40th percentile for her gestational age.    There was normal amniotic fluid noted with a total AFI of 12.45 cm.  The fetus was in the vertex presentation.    As the fetal growth is within normal limits, no further exams were scheduled in our office.    The patient stated that all of her questions were answered today.  A total of 10 minutes was spent counseling and coordinating the care for this patient.  Greater than 50% of the time was spent in direct face-to-face contact.

## 2024-01-24 LAB — OB RESULTS CONSOLE GBS: GBS: POSITIVE

## 2024-02-14 ENCOUNTER — Other Ambulatory Visit: Payer: Self-pay

## 2024-02-14 ENCOUNTER — Inpatient Hospital Stay (HOSPITAL_COMMUNITY)
Admission: AD | Admit: 2024-02-14 | Discharge: 2024-02-16 | DRG: 807 | Disposition: A | Discharge status: Home / Self Care | Attending: Obstetrics & Gynecology | Admitting: Obstetrics & Gynecology

## 2024-02-14 ENCOUNTER — Encounter (HOSPITAL_COMMUNITY): Payer: Self-pay | Admitting: Obstetrics & Gynecology

## 2024-02-14 DIAGNOSIS — O99824 Streptococcus B carrier state complicating childbirth: Principal | ICD-10-CM | POA: Diagnosis present

## 2024-02-14 DIAGNOSIS — Z87891 Personal history of nicotine dependence: Secondary | ICD-10-CM | POA: Diagnosis not present

## 2024-02-14 DIAGNOSIS — Z880 Allergy status to penicillin: Secondary | ICD-10-CM

## 2024-02-14 DIAGNOSIS — O43193 Other malformation of placenta, third trimester: Secondary | ICD-10-CM | POA: Diagnosis present

## 2024-02-14 DIAGNOSIS — O99324 Drug use complicating childbirth: Secondary | ICD-10-CM | POA: Diagnosis present

## 2024-02-14 DIAGNOSIS — F1290 Cannabis use, unspecified, uncomplicated: Secondary | ICD-10-CM | POA: Diagnosis present

## 2024-02-14 DIAGNOSIS — Z3A39 39 weeks gestation of pregnancy: Secondary | ICD-10-CM

## 2024-02-14 DIAGNOSIS — O4423 Partial placenta previa NOS or without hemorrhage, third trimester: Secondary | ICD-10-CM | POA: Diagnosis not present

## 2024-02-14 DIAGNOSIS — B951 Streptococcus, group B, as the cause of diseases classified elsewhere: Secondary | ICD-10-CM | POA: Diagnosis present

## 2024-02-14 DIAGNOSIS — O26893 Other specified pregnancy related conditions, third trimester: Secondary | ICD-10-CM | POA: Diagnosis present

## 2024-02-14 DIAGNOSIS — O99820 Streptococcus B carrier state complicating pregnancy: Secondary | ICD-10-CM | POA: Diagnosis not present

## 2024-02-14 DIAGNOSIS — Z7982 Long term (current) use of aspirin: Secondary | ICD-10-CM | POA: Diagnosis not present

## 2024-02-14 LAB — TYPE AND SCREEN
ABO/RH(D): O POS
Antibody Screen: NEGATIVE

## 2024-02-14 LAB — CBC
HCT: 36.9 % (ref 36.0–46.0)
Hemoglobin: 12.9 g/dL (ref 12.0–15.0)
MCH: 30.2 pg (ref 26.0–34.0)
MCHC: 35 g/dL (ref 30.0–36.0)
MCV: 86.4 fL (ref 80.0–100.0)
Platelets: 214 K/uL (ref 150–400)
RBC: 4.27 MIL/uL (ref 3.87–5.11)
RDW: 13 % (ref 11.5–15.5)
WBC: 12.7 K/uL — ABNORMAL HIGH (ref 4.0–10.5)
nRBC: 0 % (ref 0.0–0.2)

## 2024-02-14 MED ORDER — ONDANSETRON HCL 4 MG/2ML IJ SOLN: 4.0000 mg | Freq: Four times a day (QID) | INTRAMUSCULAR | Status: DC | PRN

## 2024-02-14 MED ORDER — LACTATED RINGERS IV SOLN: INTRAVENOUS | Status: DC

## 2024-02-14 MED ORDER — OXYTOCIN BOLUS FROM INFUSION
333.0000 mL | Freq: Once | INTRAVENOUS | Status: AC
  Administered 2024-02-15: 333 mL via INTRAVENOUS

## 2024-02-14 MED ORDER — OXYCODONE-ACETAMINOPHEN 5-325 MG PO TABS
2.0000 | ORAL_TABLET | ORAL | Status: DC | PRN
  Administered 2024-02-15: 2 via ORAL
  Filled 2024-02-14: qty 2

## 2024-02-14 MED ORDER — CEFAZOLIN SODIUM-DEXTROSE 2-4 GM/100ML-% IV SOLN
2.0000 g | Freq: Once | INTRAVENOUS | Status: AC
  Administered 2024-02-14: 2 g via INTRAVENOUS
  Filled 2024-02-14: qty 100

## 2024-02-14 MED ORDER — CEFAZOLIN SODIUM-DEXTROSE 1-4 GM/50ML-% IV SOLN: 1.0000 g | Freq: Three times a day (TID) | INTRAVENOUS | Status: DC

## 2024-02-14 MED ORDER — OXYTOCIN-SODIUM CHLORIDE 30-0.9 UT/500ML-% IV SOLN
2.5000 [IU]/h | INTRAVENOUS | Status: DC
  Filled 2024-02-14: qty 500

## 2024-02-14 MED ORDER — LACTATED RINGERS IV SOLN: 500.0000 mL | INTRAVENOUS | Status: DC | PRN

## 2024-02-14 MED ORDER — OXYCODONE-ACETAMINOPHEN 5-325 MG PO TABS: 1.0000 | ORAL_TABLET | ORAL | Status: DC | PRN

## 2024-02-14 MED ORDER — FENTANYL CITRATE (PF) 100 MCG/2ML IJ SOLN
100.0000 ug | INTRAMUSCULAR | Status: DC | PRN
  Administered 2024-02-14: 100 ug via INTRAVENOUS
  Filled 2024-02-14: qty 2

## 2024-02-14 MED ORDER — ACETAMINOPHEN 325 MG PO TABS: 650.0000 mg | ORAL_TABLET | ORAL | Status: DC | PRN

## 2024-02-14 MED ORDER — SOD CITRATE-CITRIC ACID 500-334 MG/5ML PO SOLN: 30.0000 mL | ORAL | Status: DC | PRN

## 2024-02-14 MED ORDER — LIDOCAINE HCL (PF) 1 % IJ SOLN: 30.0000 mL | INTRAMUSCULAR | Status: DC | PRN

## 2024-02-14 NOTE — H&P (Signed)
 OBSTETRIC ADMISSION HISTORY AND PHYSICAL  Lindsey Norman is a 23 y.o. female G1P0000 with IUP at [redacted]w[redacted]d by LMP c/w US  presenting for contractions. She reports +FMs, No LOF, no VB, no blurry vision, headaches or peripheral edema, and RUQ pain. She plans on breast feeding. She is undecided about birth control. She received her prenatal care at [facility].   Dating: By LMP c/w US  => Estimated Date of Delivery: 02/16/24  Sono:    @[redacted]w[redacted]d , CWD, normal anatomy, vertex presentation,, 2637 g (5 lb 13 oz), 40% EFW   Prenatal History/Complications:  - Marginal insertion of umbilical cord following with MFM - GBS+ - Cannabis use during pregnancy (positive 09/24/23, negative 01/24/24) - Allergy to penicillin (rash)  Past Medical History: Past Medical History:  Diagnosis Date   Anxiety    Depression     Past Surgical History: Past Surgical History:  Procedure Laterality Date   NO PAST SURGERIES      Obstetrical History: OB History     Gravida  1   Para  0   Term  0   Preterm  0   AB  0   Living  0      SAB  0   IAB  0   Ectopic  0   Multiple  0   Live Births  0           Social History Social History   Socioeconomic History   Marital status: Single    Spouse name: Not on file   Number of children: Not on file   Years of education: Not on file   Highest education level: Not on file  Occupational History   Not on file  Tobacco Use   Smoking status: Former    Current packs/day: 0.00    Types: E-cigarettes, Cigarettes    Quit date: 2020    Years since quitting: 5.7   Smokeless tobacco: Never  Vaping Use   Vaping status: Former  Substance and Sexual Activity   Alcohol use: Not Currently   Drug use: Yes    Types: Marijuana    Comment: last use several mnths ago   Sexual activity: Yes    Comment: kyleena   Other Topics Concern   Not on file  Social History Narrative   Not on file   Social Drivers of Health   Financial Resource Strain: Not on file   Food Insecurity: No Food Insecurity (02/14/2024)   Hunger Vital Sign    Worried About Running Out of Food in the Last Year: Never true    Ran Out of Food in the Last Year: Never true  Transportation Needs: No Transportation Needs (02/14/2024)   PRAPARE - Administrator, Civil Service (Medical): No    Lack of Transportation (Non-Medical): No  Physical Activity: Not on file  Stress: Not on file  Social Connections: Not on file    Family History: Family History  Problem Relation Age of Onset   COPD Mother        related to smoking   Anxiety disorder Mother    Depression Mother    Mental illness Mother        anxiety   Depression Father    Mental illness Father        anxiety   Cancer Maternal Grandfather        lung   Heart disease Neg Hx    Hypertension Neg Hx     Allergies: Allergies  Allergen Reactions  Penicillins Rash    Medications Prior to Admission  Medication Sig Dispense Refill Last Dose/Taking   aspirin EC 81 MG tablet Take 81 mg by mouth daily. Swallow whole.   02/13/2024   Ferrous Sulfate  (IRON PO) Take by mouth.   Past Week   Prenatal Vit-Fe Fumarate-FA (PRENATAL COMPLETE) 14-0.4 MG TABS Take one by mouth daily. 30 tablet 2 02/13/2024     Review of Systems   All systems reviewed and negative except as stated in HPI  Blood pressure 114/75, pulse (!) 110, temperature 98.7 F (37.1 C), temperature source Oral, resp. rate 16, last menstrual period 05/12/2023, SpO2 99%. General appearance: alert, cooperative, and no distress Lungs: clear to auscultation bilaterally Heart: regular rate and rhythm Abdomen: soft, non-tender; bowel sounds normal Extremities: Homans sign is negative, no sign of DVT DTR's 2+ Presentation: cephalic Fetal monitoringBaseline: 140 bpm, Variability: Good {> 6 bpm), Accelerations: Reactive, and Decelerations: Absent Uterine activityq 2 minutes, strong Dilation: 6 Effacement (%): 90 Station: -2 Exam by:: Lum Sharps  RN   Prenatal labs: ABO, Rh: --/--/O POS (09/18 2038) O pos 09/24/23 Antibody: NEG (09/18 2038) Negative 09/24/23 Rubella:  Immune 09/24/23 RPR:   NR 09/24/23 HBsAg:   NR 09/24/23 HIV:   NR 09/24/23 GBS: Positive/-- (08/28 0000)    Lab Results  Component Value Date   GBS Positive 01/24/2024   GTT 103 @ 1 hr 12/06/23 Genetic screening  negative Anatomy US  marginal cord insertion with serial growth ultrasounds with MFM => fetal growth wnl 01/16/24 no further exams indicated  Immunization History  Administered Date(s) Administered   Influenza,inj,Quad PF,6+ Mos 07/20/2017, 03/25/2018, declined 12/06/23   Tdap  12/06/23    Prenatal Transfer Tool  Maternal Diabetes: No Genetic Screening: Normal Maternal Ultrasounds/Referrals: Other: Fetal Ultrasounds or other Referrals:  Referred to Materal Fetal Medicine for marginal cord insertion last US  01/16/24 Maternal Substance Abuse:  Yes:  Type: Marijuana Significant Maternal Medications:  None Significant Maternal Lab Results: Group B Strep positive Number of Prenatal Visits:greater than 3 verified prenatal visits Maternal Vaccinations:TDap Other Comments:  None   Results for orders placed or performed during the hospital encounter of 02/14/24 (from the past 24 hours)  CBC   Collection Time: 02/14/24  8:38 PM  Result Value Ref Range   WBC 12.7 (H) 4.0 - 10.5 K/uL   RBC 4.27 3.87 - 5.11 MIL/uL   Hemoglobin 12.9 12.0 - 15.0 g/dL   HCT 63.0 63.9 - 53.9 %   MCV 86.4 80.0 - 100.0 fL   MCH 30.2 26.0 - 34.0 pg   MCHC 35.0 30.0 - 36.0 g/dL   RDW 86.9 88.4 - 84.4 %   Platelets 214 150 - 400 K/uL   nRBC 0.0 0.0 - 0.2 %  Type and screen [facility]   Collection Time: 02/14/24  8:38 PM  Result Value Ref Range   ABO/RH(D) O POS    Antibody Screen NEG    Sample Expiration      02/17/2024,2359 Performed at [facility], [address] [place], [place] [address]     Patient Active Problem List   Diagnosis Date Noted    Indication for care in labor or delivery 02/14/2024   Marginal insertion of umbilical cord affecting management of mother in second trimester 10/19/2023   Vitamin D  deficiency 04/11/2018   Vitamin B12 deficiency 04/11/2018   Depression, major, recurrent (HCC) 04/11/2018   Moderate recurrent major depression (HCC) 04/11/2018   Cannabis abuse 04/11/2018  Anxiety 07/30/2017   Panic attack 07/30/2017   Migraine 07/30/2017   Depression 07/30/2017    Assessment/Plan:  Lindsey Norman is a 23 y.o. G1P0000 at [redacted]w[redacted]d here for   #Labor:actiave #Pain: Per request #FWB: Cat I tracing #GBS status: Positive, Ancef  given mild penicillin allergy (rash) #Feeding: Breastmilk  #Reproductive Life planning: {Contraceptives:21111124} #Circ:  {yes/no/default n/a:21102::not applicable}  Jayson LELON Ness, Medical Student  02/14/2024, 9:39 PM

## 2024-02-14 NOTE — MAU Note (Signed)
 Lindsey Norman is a 23 y.o. at [redacted]w[redacted]d here in MAU reporting: random cramping and contractions over the last month, but regular consistent contractions started at 12noon today.   No vag bleeding, no leakage of fluid and baby is moving normal.  Onset of complaint: regular contractions around 12 today Pain score: 4 Vitals:   02/14/24 1611  BP: 114/75  Pulse: (!) 110  Resp: 16  Temp: 98.7 F (37.1 C)  SpO2: 99%

## 2024-02-15 ENCOUNTER — Encounter (HOSPITAL_COMMUNITY): Payer: Self-pay | Admitting: Family Medicine

## 2024-02-15 DIAGNOSIS — O99324 Drug use complicating childbirth: Secondary | ICD-10-CM

## 2024-02-15 DIAGNOSIS — Z3A39 39 weeks gestation of pregnancy: Secondary | ICD-10-CM

## 2024-02-15 DIAGNOSIS — O4423 Partial placenta previa NOS or without hemorrhage, third trimester: Secondary | ICD-10-CM

## 2024-02-15 DIAGNOSIS — O99820 Streptococcus B carrier state complicating pregnancy: Secondary | ICD-10-CM

## 2024-02-15 LAB — RPR: RPR Ser Ql: NONREACTIVE

## 2024-02-15 MED ORDER — METHYLERGONOVINE MALEATE 0.2 MG PO TABS: 0.2000 mg | ORAL_TABLET | ORAL | Status: DC | PRN

## 2024-02-15 MED ORDER — PRENATAL MULTIVITAMIN CH
1.0000 | ORAL_TABLET | Freq: Every day | ORAL | Status: DC
  Administered 2024-02-15 – 2024-02-16 (×2): 1 via ORAL
  Filled 2024-02-15 (×2): qty 1

## 2024-02-15 MED ORDER — ACETAMINOPHEN 325 MG PO TABS
650.0000 mg | ORAL_TABLET | ORAL | Status: DC | PRN
  Filled 2024-02-15: qty 2

## 2024-02-15 MED ORDER — FERROUS SULFATE 325 (65 FE) MG PO TABS
325.0000 mg | ORAL_TABLET | ORAL | Status: DC
  Administered 2024-02-15: 325 mg via ORAL
  Filled 2024-02-15: qty 1

## 2024-02-15 MED ORDER — ONDANSETRON HCL 4 MG PO TABS: 4.0000 mg | ORAL_TABLET | ORAL | Status: DC | PRN

## 2024-02-15 MED ORDER — MEASLES, MUMPS & RUBELLA VAC IJ SOLR
0.5000 mL | Freq: Once | INTRAMUSCULAR | Status: DC
Start: 2024-02-16 — End: 2024-02-16

## 2024-02-15 MED ORDER — SIMETHICONE 80 MG PO CHEW: 80.0000 mg | CHEWABLE_TABLET | ORAL | Status: DC | PRN

## 2024-02-15 MED ORDER — ONDANSETRON HCL 4 MG/2ML IJ SOLN: 4.0000 mg | INTRAMUSCULAR | Status: DC | PRN

## 2024-02-15 MED ORDER — OXYCODONE HCL 5 MG PO TABS: 5.0000 mg | ORAL_TABLET | ORAL | Status: DC | PRN

## 2024-02-15 MED ORDER — METHYLERGONOVINE MALEATE 0.2 MG/ML IJ SOLN: 0.2000 mg | INTRAMUSCULAR | Status: DC | PRN

## 2024-02-15 MED ORDER — DIBUCAINE (PERIANAL) 1 % EX OINT: 1.0000 | TOPICAL_OINTMENT | CUTANEOUS | Status: DC | PRN

## 2024-02-15 MED ORDER — DIPHENHYDRAMINE HCL 25 MG PO CAPS: 25.0000 mg | ORAL_CAPSULE | Freq: Four times a day (QID) | ORAL | Status: DC | PRN

## 2024-02-15 MED ORDER — BENZOCAINE-MENTHOL 20-0.5 % EX AERO
1.0000 | INHALATION_SPRAY | CUTANEOUS | Status: DC | PRN
  Filled 2024-02-15: qty 56

## 2024-02-15 MED ORDER — WITCH HAZEL-GLYCERIN EX PADS: 1.0000 | MEDICATED_PAD | CUTANEOUS | Status: DC | PRN

## 2024-02-15 MED ORDER — ZOLPIDEM TARTRATE 5 MG PO TABS: 5.0000 mg | ORAL_TABLET | Freq: Every evening | ORAL | Status: DC | PRN

## 2024-02-15 MED ORDER — TETANUS-DIPHTH-ACELL PERTUSSIS 5-2.5-18.5 LF-MCG/0.5 IM SUSY
0.5000 mL | PREFILLED_SYRINGE | Freq: Once | INTRAMUSCULAR | Status: DC
Start: 2024-02-16 — End: 2024-02-16

## 2024-02-15 MED ORDER — SENNOSIDES-DOCUSATE SODIUM 8.6-50 MG PO TABS
2.0000 | ORAL_TABLET | Freq: Every day | ORAL | Status: DC
  Administered 2024-02-16: 2 via ORAL
  Filled 2024-02-15: qty 2

## 2024-02-15 MED ORDER — COCONUT OIL OIL: 1.0000 | TOPICAL_OIL | Status: DC | PRN

## 2024-02-15 MED ORDER — IBUPROFEN 600 MG PO TABS
600.0000 mg | ORAL_TABLET | Freq: Four times a day (QID) | ORAL | Status: DC
  Administered 2024-02-15 – 2024-02-16 (×3): 600 mg via ORAL
  Filled 2024-02-15 (×6): qty 1

## 2024-02-15 MED ORDER — MEDROXYPROGESTERONE ACETATE 150 MG/ML IM SUSP: 150.0000 mg | INTRAMUSCULAR | Status: DC | PRN

## 2024-02-15 NOTE — Lactation Note (Signed)
 This note was copied from a baby's chart. Lactation Consultation Note  Patient Name: Boy Adelheid Hoggard Unijb'd Date: 02/15/2024 Age:25 hours  LC saw the door half opened and someone in rm. LC went to see mom. Mom was awake so LC attempted to consult w/mom. Mom stated it wasn't time for him to feed so she wanted to get sleep until time.=  LC will attempted to see mom later. Maternal Data    Feeding    LATCH Score Latch: Grasps breast easily, tongue down, lips flanged, rhythmical sucking.  Audible Swallowing: Spontaneous and intermittent  Type of Nipple: Everted at rest and after stimulation  Comfort (Breast/Nipple): Soft / non-tender  Hold (Positioning): Assistance needed to correctly position infant at breast and maintain latch.  LATCH Score: 9   Lactation Tools Discussed/Used    Interventions Interventions: Breast feeding basics reviewed;Assisted with latch;Skin to skin;Hand express;Adjust position;Support pillows  Discharge    Consult Status      Tahje Borawski G 02/15/2024, 5:15 AM

## 2024-02-15 NOTE — Patient Instructions (Signed)

## 2024-02-15 NOTE — Lactation Note (Signed)
 This note was copied from a baby's chart. Lactation Consultation Note  Patient Name: Lindsey Norman Unijb'd Date: 02/15/2024 Age:25 hours Reason for consult: Initial assessment;Primapara;Term  Mom awake holding baby STS waiting on Lactation to help her latch. LC assisted baby to cradle position. Baby not interested at this time. Got nipple in his mouth but he wouldn't suck then he gagged. Mom just going to hold baby STS. Newborn feeding habits, STS, I&O, reviewed. Mom encouraged to feed baby 8-12 times/24 hours and with feeding cues.  Encouraged mom to call for assistance when needed. Maternal Data Has patient been taught Hand Expression?: Yes Does the patient have breastfeeding experience prior to this delivery?: No  Feeding    LATCH Score Latch: Too sleepy or reluctant, no latch achieved, no sucking elicited.  Audible Swallowing: None  Type of Nipple: Everted at rest and after stimulation  Comfort (Breast/Nipple): Soft / non-tender  Hold (Positioning): Assistance needed to correctly position infant at breast and maintain latch.  LATCH Score: 5   Lactation Tools Discussed/Used    Interventions Interventions: Breast feeding basics reviewed;Assisted with latch;Skin to skin;Breast massage;Hand express;Breast compression;Adjust position;Support pillows;Position options;Education;LC Services brochure  Discharge Pump: DEBP  Consult Status Consult Status: Follow-up Date: 02/15/24 Follow-up type: In-patient    Saysha Menta G 02/15/2024, 6:26 AM

## 2024-02-15 NOTE — Discharge Summary (Cosign Needed Addendum)
 Postpartum Discharge Summary     Patient Name: Lindsey Norman DOB: [birth year] MRN: [id number]  Date of admission: 02/14/2024 Delivery date:02/15/2024 Delivering provider: HERMAN ROLIN SAILOR Date of discharge: 02/16/2024  Admitting diagnosis: Indication for care in labor or delivery [O75.9] Intrauterine pregnancy: [redacted]w[redacted]d     Secondary diagnosis:  Principal Problem:   Indication for care in labor or delivery Active Problems:   Vaginal delivery   Positive testing for group B Streptococcus  Additional problems: none    Discharge diagnosis: Term Pregnancy Delivered                                              Post partum procedures:none Augmentation: AROM Complications: None  Hospital course: Onset of Labor With Vaginal Delivery      23 y.o. yo G1P1001 at [redacted]w[redacted]d was admitted in Active Labor on 02/14/2024. Labor course was complicated by only receiving Ancef  x 1 five hours prior to delivery for GBS ppx.  Membrane Rupture Time/Date: 12:52 AM,02/15/2024  Delivery Method:Vaginal, Spontaneous Operative Delivery:N/A Episiotomy: None Lacerations:  None Patient had an uncomplicated postpartum course.  She is ambulating, tolerating a regular diet, passing flatus, and urinating well. Patient is discharged home in stable condition on 02/16/24, pending her son's circumcision.  Newborn Data: Birth date:02/15/2024 Birth time:1:24 AM Gender:Female Living status:Living Apgars:9 ,9  Weight:3430 g (7lb 9oz)  Magnesium Sulfate received: No BMZ received: No Rhophylac:N/A MMR:N/A T-DaP:Given prenatally Flu: N/A RSV Vaccine received: No Transfusion:No  Immunizations received: Immunization History  Administered Date(s) Administered   Influenza,inj,Quad PF,6+ Mos 07/20/2017, 03/25/2018    Physical exam  Vitals:   02/15/24 1115 02/15/24 1525 02/15/24 1950 02/16/24 0500  BP: 105/64 107/74 108/61 100/65  Pulse: 78 82 89 85  Resp: 20 20 18 18   Temp: 98.6 F (37 C) 98.3 F (36.8 C)  98.5 F  (36.9 C)  TempSrc: Oral Oral  Oral  SpO2: 100% 99% 99% 99%   General: alert and cooperative Lochia: appropriate Uterine Fundus: firm Incision: N/A DVT Evaluation: No evidence of DVT seen on physical exam. Labs: Lab Results  Component Value Date   WBC 12.7 (H) 02/14/2024   HGB 12.9 02/14/2024   HCT 36.9 02/14/2024   MCV 86.4 02/14/2024   PLT 214 02/14/2024      Latest Ref Rng & Units 04/11/2018   11:33 AM  CMP  Glucose 70 - 99 mg/dL 98   BUN 6 - 20 mg/dL 16   Creatinine 9.55 - 1.00 mg/dL 9.43   Sodium 864 - 854 mmol/L 138   Potassium 3.5 - 5.1 mmol/L 4.6   Chloride 98 - 111 mmol/L 105   CO2 22 - 32 mmol/L 26   Calcium 8.9 - 10.3 mg/dL 89.9   Total Protein 6.5 - 8.1 g/dL 8.0   Total Bilirubin 0.3 - 1.2 mg/dL 0.9   Alkaline Phos 38 - 126 U/L 65   AST 15 - 41 U/L 30   ALT 0 - 44 U/L 32    Edinburgh Score:    02/15/2024    9:12 AM  Edinburgh Postnatal Depression Scale Screening Tool  I have been able to laugh and see the funny side of things. 0  I have looked forward with enjoyment to things. 0  I have blamed myself unnecessarily when things went wrong. 0  I have been anxious or worried for no  good reason. 1  I have felt scared or panicky for no good reason. 0  Things have been getting on top of me. 0  I have been so unhappy that I have had difficulty sleeping. 0  I have felt sad or miserable. 0  I have been so unhappy that I have been crying. 0  The thought of harming myself has occurred to me. 0  Edinburgh Postnatal Depression Scale Total 1   Edinburgh Postnatal Depression Scale Total: 1   After visit meds:  Allergies as of 02/16/2024       Reactions   Penicillins Rash        Medication List     STOP taking these medications    aspirin EC 81 MG tablet   IRON PO       TAKE these medications    ibuprofen  600 MG tablet Commonly known as: ADVIL  Take 1 tablet (600 mg total) by mouth every 6 (six) hours as needed.   Prenatal Complete 14-0.4 MG  Tabs Take one by mouth daily.         Discharge home in stable condition Infant Feeding: Breast Infant Disposition:home with mother Discharge instruction: per After Visit Summary and Postpartum booklet. Activity: Advance as tolerated. Pelvic rest for 6 weeks.  Diet: routine diet Future Appointments:No future appointments. Follow up Visit:  Follow-up Information     Department, [facility]. Schedule an appointment as soon as possible for a visit in 4 week(s).   Why: For your postpartum appointment Contact information: [address] [place] [address] [phone]                She will contact [facility] for PP appt  02/16/2024 Suzen JONETTA Gentry, CNM

## 2024-02-16 DIAGNOSIS — B951 Streptococcus, group B, as the cause of diseases classified elsewhere: Secondary | ICD-10-CM | POA: Diagnosis present

## 2024-02-16 MED ORDER — IBUPROFEN 600 MG PO TABS: 600.0000 mg | ORAL_TABLET | Freq: Four times a day (QID) | ORAL | 0 refills | Status: AC | PRN

## 2024-02-16 NOTE — Lactation Note (Signed)
 This note was copied from a baby's chart. Lactation Consultation Note  Patient Name: Lindsey Norman Unijb'd Date: 02/16/2024 Age:25 hours   Attempted to see mom but everyone was sleeping.   Maternal Data    Feeding    LATCH Score                    Lactation Tools Discussed/Used    Interventions    Discharge    Consult Status      [name] 02/16/2024, 3:25 AM

## 2024-02-16 NOTE — Progress Notes (Signed)
 MOB was referred for history of depression/anxiety. * Referral screened out by Clinical Social Worker because none of the following criteria appear to apply: ~ History of anxiety/depression during this pregnancy, or of post-partum depression following prior delivery. ~ Diagnosis of anxiety and/or depression within last 3 years. MOB's diagnosis of depression and anxiety dates back to 2019, patient last reported symptoms in 2021. No mental health concerns noted in prenatal care record. Edinburgh score of 1 during current hospitalization.  OR * MOB's symptoms currently being treated with medication and/or therapy. Please contact the Clinical Social Worker if needs arise, by [facility] request, or if MOB scores greater than 9/yes to question 10 on Edinburgh Postpartum Depression Screen.  Signed,  Sharyne LOIS Roulette, MSW, LCSWA, LCASA 2023-12-29 8:52 AM

## 2024-02-16 NOTE — Discharge Instructions (Signed)

## 2024-02-16 NOTE — Lactation Note (Signed)
 This note was copied from a baby's chart. Lactation Consultation Note  Patient Name: Lindsey Norman Date: 02/16/2024 Age:25 hours Reason for consult: 1st time breastfeeding;Follow-up assessment;Term (Day 2 with weight loss -7.27%) C/O: P1, weight loss of -7.27% at 35 hours, Per MOB, having pain with few latches, LC observed MOB has not been using pillow support and infant latch appears  shallow.   MOB had infant latched on her left breast using the cradle hold, LC gave MOB pillow support and worked with re-latching infant at the breast with depth, infant breastfeed for 27 minutes with this feeding.LC assisted with changing infant's diaper, he had green stool and void, since 0100 am infant had 3 stools and two void diaper. MOB understands if she is feeling pain and discomfort to break infant's latch and re-latch infant to breast. MOB knows to ask for further latch assistance if needed. LC set MOB up with DEBP to help stimulate supply and give infant back extra volume. LC reviewed hand expression and infant was given 2 mls of colostrum by spoon. [facility] went to [facility] for his circumcision today while MOB was pumping. LC discussed that her EBM is safe for 4 hours at room temperature.   Current feeding plan: 1- MOB will continue to breastfeed infant by cues, on demand, 8-12 times within 24 hours, skin to skin. 2- MOB knows if she feels pain or discomfort to break infant's latch and re-latch infant to breast. MOB knows to call for further latch assistance if needed. 3- MOB will continue to use the DEBP and give infant back any EBM that is pump  or from hand expression will supplement infant after feedings to help stabilize his weight loss.    Maternal Data    Feeding Mother's Current Feeding Choice: Breast Milk  LATCH Score Latch: Grasps breast easily, tongue down, lips flanged, rhythmical sucking.  Audible Swallowing: A few with stimulation  Type of Nipple: Everted at rest  and after stimulation  Comfort (Breast/Nipple): Soft / non-tender  Hold (Positioning): Assistance needed to correctly position infant at breast and maintain latch.  LATCH Score: 8   Lactation Tools Discussed/Used Tools: Pump;Flanges Flange Size: 21 Breast pump type: Double-Electric Breast Pump Pump Education: Setup, frequency, and cleaning;Milk Storage Reason for Pumping: due infant having weight loss of -7.27% at 35 hours of life. Pumping frequency: MOB will continue to pump every 3 hours for 15 minutes.  Interventions Interventions: Assisted with latch;Skin to skin;Breast compression;Adjust position;Support pillows;Position options;Education;Guidelines for Milk Supply and Pumping Schedule Handout;CDC milk storage guidelines;CDC Guidelines for Breast Pump Cleaning;LC Services brochure;DEBP  Discharge Pump: DEBP;Personal  Consult Status Consult Status: Follow-up Date: 02/17/24 Follow-up type: In-patient    Lindsey Norman 02/16/2024, 1:21 PM

## 2024-02-27 ENCOUNTER — Telehealth (HOSPITAL_COMMUNITY): Payer: Self-pay | Admitting: *Deleted

## 2024-02-27 NOTE — Telephone Encounter (Signed)
 02/27/2024  Name: Lindsey Norman MRN: [id number] DOB: 25-May-2001  Reason for Call:  Transition of Care Hospital Discharge Call  Contact Status: Patient Contact Status: Message  Language assistant needed:          Follow-Up Questions:    Van Postnatal Depression Scale:  In the Past 7 Days:    PHQ2-9 Depression Scale:     Discharge Follow-up:    Post-discharge interventions: NA  Mliss Sieve, RN 02/27/2024 09:43
